# Patient Record
Sex: Male | Born: 1975 | Race: White | Hispanic: No | Marital: Single
Health system: Midwestern US, Community
[De-identification: ages and names within clinical notes are randomized; demographics above are authoritative.]

## PROBLEM LIST (undated history)

## (undated) DIAGNOSIS — K603 Anal fistula, unspecified: Secondary | ICD-10-CM

## (undated) DIAGNOSIS — K641 Second degree hemorrhoids: Secondary | ICD-10-CM

## (undated) DIAGNOSIS — K602 Anal fissure, unspecified: Secondary | ICD-10-CM

## (undated) DIAGNOSIS — I10 Essential (primary) hypertension: Secondary | ICD-10-CM

## (undated) DIAGNOSIS — G35 Multiple sclerosis: Secondary | ICD-10-CM

## (undated) DIAGNOSIS — E785 Hyperlipidemia, unspecified: Secondary | ICD-10-CM

## (undated) DIAGNOSIS — R4189 Other symptoms and signs involving cognitive functions and awareness: Secondary | ICD-10-CM

---

## 2012-04-14 ENCOUNTER — Inpatient Hospital Stay: Admit: 2012-04-14 | Discharge: 2012-04-14 | Discharge status: Against Medical Advice

## 2016-03-12 DIAGNOSIS — G35 Multiple sclerosis: Secondary | ICD-10-CM | POA: Diagnosis present

## 2017-05-06 ENCOUNTER — Inpatient Hospital Stay: Admit: 2017-05-06 | Discharge: 2017-05-06 | Attending: Emergency Medicine

## 2017-05-06 DIAGNOSIS — S0083XA Contusion of other part of head, initial encounter: Secondary | ICD-10-CM

## 2017-05-06 NOTE — Discharge Instructions (Signed)
ICE area to reduce swelling and help with pain

## 2017-05-06 NOTE — ED Notes (Signed)
Patient given AVS, discharge instructions and prescriptions. Verbalizes understanding no further needs identified at this time.     Darden Amber, RN  05/06/17 [phone]

## 2017-05-06 NOTE — ED Provider Notes (Signed)
The history is provided by the patient.   Fall   Incident onset: 3 days ago. Fall occurred: missed his wheel chair and fell forward hit head. He landed on a hard floor. There was no blood loss. The point of impact was the head. The pain is present in the head. There was no entrapment after the fall. There was no drug use involved in the accident. There was no alcohol use involved in the accident. Pertinent negatives include no visual change, no fever, no numbness, no abdominal pain, no bowel incontinence, no nausea, no vomiting, no hematuria, no headaches, no hearing loss, no loss of consciousness and no tingling. He has tried ice for the symptoms. The treatment provided mild relief.       Review of Systems   Constitutional: Negative.  Negative for fever.   HENT: Negative.    Eyes: Negative.    Respiratory: Negative.    Cardiovascular: Negative.    Gastrointestinal: Negative.  Negative for abdominal pain, bowel incontinence, nausea and vomiting.   Genitourinary: Negative.  Negative for hematuria.   Musculoskeletal: Negative.    Skin: Negative.    Neurological: Negative.  Negative for tingling, loss of consciousness, numbness and headaches.   Endo/Heme/Allergies: Negative.    All other systems reviewed and are negative.      History reviewed. No pertinent family history.  Social History     Social History   . Marital status: Single     Spouse name: N/A   . Number of children: N/A   . Years of education: N/A     Occupational History   . Not on file.     Social History Main Topics   . Smoking status: Never Smoker   . Smokeless tobacco: Never Used   . Alcohol use No   . Drug use: No   . Sexual activity: No     Other Topics Concern   . Not on file     Social History Narrative   . No narrative on file     History reviewed. No pertinent surgical history.  Past Medical History:   Diagnosis Date   . MS (multiple sclerosis) (HCC)      No Known Allergies  Prior to Admission medications    Medication Sig Start Date End Date  Taking? Authorizing Provider   baclofen (LIORESAL) 10 MG tablet Take 10 mg by mouth 2 times daily 08/23/16  Yes Historical Provider, MD   vitamin D (ERGOCALCIFEROL) 50000 units CAPS capsule Take 50,000 Units by mouth once a week 05/05/17  Yes Historical Provider, MD   gabapentin (NEURONTIN) 300 MG capsule Take 300 mg by mouth 3 times daily.. 07/08/16  Yes Historical Provider, MD   tolterodine (DETROL) 2 MG tablet Take 2 mg by mouth daily    Historical Provider, MD   sertraline (ZOLOFT) 50 MG tablet Take 50 mg by mouth daily    Historical Provider, MD   losartan-hydrochlorothiazide (HYZAAR) 50-12.5 MG per tablet Take 1 tablet by mouth daily    Historical Provider, MD       BP 129/89   Pulse 100   Temp 98.4 F (36.9 C) (Oral)   Resp 16   Ht 5\' 8"  (1.727 m)   Wt 186 lb (84.4 kg)   SpO2 99%   BMI 28.28 kg/m     Physical Exam   Constitutional: He is oriented to person, place, and time. He appears well-developed and well-nourished.   HENT:   Head: Normocephalic.  Right Ear: External ear normal.   Left Ear: External ear normal.   Nose: Nose normal.   Mouth/Throat: Oropharynx is clear and moist.   Blue circle is some bruising and tenderness   Eyes: Pupils are equal, round, and reactive to light. Conjunctivae and EOM are normal.   Neck: Normal range of motion. Neck supple.   Cardiovascular: Normal rate, regular rhythm, normal heart sounds and intact distal pulses.    Pulmonary/Chest: Effort normal and breath sounds normal.   Abdominal: Soft. Bowel sounds are normal.   Musculoskeletal: Normal range of motion.   Neurological: He is alert and oriented to person, place, and time. GCS eye subscore is 4. GCS verbal subscore is 5. GCS motor subscore is 6.   Skin: Skin is warm and dry.   Psychiatric: He has a normal mood and affect. His behavior is normal. Judgment and thought content normal.       MDM:    No results found for this visit on 05/06/17.  CT Facial Bones WO Contrast   Final Result   No acute traumatic injury  of the facial bones.               Ice and OTC motrin  My typical dicussion, presentation, and considerations for this patients' chief complaint, diagnosis, differential diagnosis, medications, medication use,  medication safety and medication interactions have been explained and outlined to this patient for this patient encounter. I have stressed need for follow up and reexamination for this encounter and or return to the emergency department if any changes or any concern.      Final Impression    1. Contusion of face, initial encounter              Cory Scales, DO  05/06/17 1947

## 2018-05-17 ENCOUNTER — Emergency Department: Admit: 2018-05-17 | Payer: MEDICAID | Primary: Internal Medicine

## 2018-05-17 ENCOUNTER — Inpatient Hospital Stay
Admit: 2018-05-17 | Discharge: 2018-05-17 | Disposition: A | Discharge status: Home / Self Care | Payer: MEDICAID | Attending: Emergency Medicine

## 2018-05-17 DIAGNOSIS — S7001XA Contusion of right hip, initial encounter: Secondary | ICD-10-CM

## 2018-05-17 LAB — URINALYSIS WITH MICROSCOPIC
Bilirubin Urine: NEGATIVE MG/DL
Cast Type: NEGATIVE /HPF — AB
Crystal Type: NEGATIVE /HPF
Epithelial Cells, UA: 1 /HPF
Glucose, Urine: NEGATIVE MG/DL
Ketones, Urine: NEGATIVE MG/DL
Nitrite Urine, Quantitative: NEGATIVE
RBC, UA: NEGATIVE /HPF (ref 0–3)
Specific Gravity, UA: 1.015 (ref 1.001–1.035)
Urobilinogen, Urine: 0.2 MG/DL (ref 0.2–1.0)
WBC, UA: 78 /HPF (ref 0–2)
pH, Urine: 6 (ref 5.0–8.0)

## 2018-05-17 MED ORDER — PHENAZOPYRIDINE HCL 100 MG PO TABS
100 MG | ORAL_TABLET | Freq: Three times a day (TID) | ORAL | 0 refills | Status: AC | PRN
Start: 2018-05-17 — End: 2018-05-20

## 2018-05-17 MED ORDER — SULFAMETHOXAZOLE-TRIMETHOPRIM 800-160 MG PO TABS
800-160 MG | ORAL_TABLET | Freq: Two times a day (BID) | ORAL | 0 refills | Status: AC
Start: 2018-05-17 — End: 2018-05-27

## 2018-05-17 NOTE — ED Provider Notes (Signed)
Emergency Department Encounter  Location: [facility] EMERGENCY DEPARTMENT    Patient: Cory Nelson  MRN: [id number]  DOB: 1976/02/06  Date of evaluation: 05/17/2018  ED Provider: Brayton Layman, DO, FACEP    Chief Complaint:    Hip Pain (Pt arrives ambulatory with father who states pt has to be catheterized 3-4 times a day.  Father states he has noticed in last week that his urine is milky white.   Pt denies any urinary sx but complains of hip pain) and Dysuria    HOPI:  Cory Nelson is a 42 y.o. male that presents to the emergency department with complaints of possible urinary tract infection.  His father states that this patient is to be Catheterized 3-4 times daily.  He states that he is noticed that his urine is milky white.  He denies any urinary symptoms but he is having pain in his right hip.  He states he slipped and fell in the bathroom.  This was about a week ago and since then he is been having pain in his right lateral hip.  He has not taken any ibuprofen or Tylenol for the pain.    ROS:  At least 4 systems reviewed and otherwise acutely negative except as in the HOPI.    Past Medical History:   Diagnosis Date   ??? GERD (gastroesophageal reflux disease)    ??? Hyperlipidemia    ??? Hypertension    ??? MS (multiple sclerosis) (HCC)      Past Surgical History:   Procedure Laterality Date   ??? COLONOSCOPY     ??? UPPER GASTROINTESTINAL ENDOSCOPY       History reviewed. No pertinent family history.  Social History     Socioeconomic History   ??? Marital status: Single     Spouse name: Not on file   ??? Number of children: Not on file   ??? Years of education: Not on file   ??? Highest education level: Not on file   Occupational History   ??? Not on file   Social Needs   ??? Financial resource strain: Not on file   ??? Food insecurity:     Worry: Not on file     Inability: Not on file   ??? Transportation needs:     Medical: Not on file     Non-medical: Not on file   Tobacco Use   ??? Smoking status: Current Every Day Smoker      Types: Cigarettes   ??? Smokeless tobacco: Never Used   Substance and Sexual Activity   ??? Alcohol use: No   ??? Drug use: Yes     Types: Marijuana   ??? Sexual activity: Never   Lifestyle   ??? Physical activity:     Days per week: Not on file     Minutes per session: Not on file   ??? Stress: Not on file   Relationships   ??? Social connections:     Talks on phone: Not on file     Gets together: Not on file     Attends religious service: Not on file     Active member of club or organization: Not on file     Attends meetings of clubs or organizations: Not on file     Relationship status: Not on file   ??? Intimate partner violence:     Fear of current or ex partner: Not on file     Emotionally abused: Not on file  Physically abused: Not on file     Forced sexual activity: Not on file   Other Topics Concern   ??? Not on file   Social History Narrative   ??? Not on file     No current facility-administered medications for this encounter.      Current Outpatient Medications   Medication Sig Dispense Refill   ??? atorvastatin (LIPITOR) 10 MG tablet Take 10 mg by mouth daily     ??? carbidopa-levodopa (SINEMET) 25-100 MG per tablet Take 1 tablet by mouth 2 times daily     ??? sulfamethoxazole-trimethoprim (BACTRIM DS) 800-160 MG per tablet Take 1 tablet by mouth 2 times daily for 10 days 20 tablet 0   ??? phenazopyridine (PYRIDIUM) 100 MG tablet Take 2 tablets by mouth 3 times daily as needed for Pain 18 tablet 0   ??? baclofen (LIORESAL) 10 MG tablet Take 10 mg by mouth 2 times daily     ??? vitamin D (ERGOCALCIFEROL) 50000 units CAPS capsule Take 50,000 Units by mouth once a week     ??? gabapentin (NEURONTIN) 300 MG capsule Take 600 mg by mouth 3 times daily.      ??? tolterodine (DETROL) 2 MG tablet Take 2 mg by mouth daily     ??? sertraline (ZOLOFT) 50 MG tablet Take 50 mg by mouth daily     ??? losartan-hydrochlorothiazide (HYZAAR) 50-12.5 MG per tablet Take 1 tablet by mouth daily       No Known Allergies  Nursing Notes Reviewed    Physical  Exam:  ED Triage Vitals [05/17/18 1024]   Enc Vitals Group      BP (!) 143/94      Pulse 58      Resp 20      Temp 98.1 ??F (36.7 ??C)      Temp Source Oral      SpO2 98 %      Weight 193 lb (87.5 kg)      Height 5\' 8"  (1.727 m)      Head Circumference       Peak Flow       Pain Score       Pain Loc       Pain Edu?       Excl. in GC?      GENERAL APPEARANCE: Awake and alert. Cooperative. No acute distress.  Non-toxic in appearance  HEAD: Normocephalic. Atraumatic.  EYES: Sclera anicteric.  ENT: Tolerates saliva.  NECK: Supple. Trachea midline.  LUNGS: Respirations unlabored.  EXTREMITIES: No acute deformities.  No pain with movement of his right hip.  There is pain to palpation in the right lateral hip region but no evidence of trauma with no bruising.  There is no redness.  SKIN: Warm and dry.  NEUROLOGICAL: No gross facial drooping.  PSYCHIATRIC: Normal mood.    Labs:  Results for orders placed or performed during the hospital encounter of 05/17/18   Urinalysis with Microscopic   Result Value Ref Range    Color, UA YELLOW YELLOW    Clarity, UA CLOUDY CLEAR    Glucose, Urine NEGATIVE NEGATIVE MG/DL    Bilirubin Urine NEGATIVE NEGATIVE MG/DL    Ketones, Urine NEGATIVE NEGATIVE MG/DL    Specific Gravity, UA 1.015 1.001 - 1.035    Blood, Urine TRACE NEGATIVE    pH, Urine 6.0 5.0 - 8.0    Protein, UA TRACE NEGATIVE MG/DL    Urobilinogen, Urine 0.2 0.2 - 1.0 MG/DL  Nitrite Urine, Quantitative NEGATIVE NEGATIVE    Leukocyte Esterase, Urine 2+ NEGATIVE    RBC, UA NEGATIVE 0 - 3 /HPF    WBC, UA 78 TO 95 0 - 2 /HPF    Epi Cells 1 TO 3 /HPF    Cast Type NEGATIVE (A) NO CAST FORMS SEEN /HPF    Bacteria, UA MANY NEGATIVE /HPF    Crystal Type NEGATIVE NEGATIVE /HPF       Radiographs (if obtained):  []  The following radiograph was interpreted by myself in the absence of a radiologist:  [x]  Radiologist's Report reviewed at time of ED visit:  XR HIP RIGHT (2-3 VIEWS)   Preliminary Result   Negative views of the right hip.              ED Course and MDM:  Patient's urinalysis shows a urinary tract infection.  He will be started on Bactrim DS.  He will be started on Pyridium.  His right hip x-ray shows no evidence of fracture.  I think he has a contusion or strain in this area.  I did offer ibuprofen or Naprosyn for him but he states he will will not use that medication because he "does not like taking medicines" according to his father.  He will be referred back to his primary caregiver for recheck.  He is discharged in stable condition at this time.    Final Impression:  1. Acute cystitis without hematuria    2. Contusion of right hip, initial encounter      DISPOSITION Decision To Discharge    Patient referred to:  your doctor    Schedule an appointment as soon as possible for a visit in 1 week  For follow up    Discharge medications:  Discharge Medication List as of 05/17/2018 12:16 PM      START taking these medications    Details   sulfamethoxazole-trimethoprim (BACTRIM DS) 800-160 MG per tablet Take 1 tablet by mouth 2 times daily for 10 days, Disp-20 tablet, R-0Print      phenazopyridine (PYRIDIUM) 100 MG tablet Take 2 tablets by mouth 3 times daily as needed for Pain, Disp-18 tablet, R-0Print           (Please note that portions of this note may have been completed with a voice recognition program. Efforts were made to edit the dictations but occasionally words are mis-transcribed.)    Brayton Layman, DO, FACEP  Board certified in Emergency Medicine  [employer]        Brayton Layman, DO  05/17/18 1236

## 2018-05-17 NOTE — ED Notes (Signed)
Pt catheterized for urine, cloudy urine out, specimen sent to lab.     Dionne Milo, RN  05/17/18 1227

## 2018-05-17 NOTE — Discharge Instructions (Signed)
Urinary Tract Infections in Men: Care Instructions  Your Care Instructions    A urinary tract infection, or UTI, is a general term for an infection anywhere between the kidneys and the tip of the penis. UTIs can also be a result of a prostate problem. Most cause pain or burning when you urinate.  Most UTIs are caused by bacteria and can be cured with antibiotics. It is important to complete your treatment so that the infection does not get worse.  Follow-up care is a key part of your treatment and safety. Be sure to make and go to all appointments, and call your doctor if you are having problems. It's also a good idea to know your test results and keep a list of the medicines you take.  How can you care for yourself at home?   Take your antibiotics as prescribed. Do not stop taking them just because you feel better. You need to take the full course of antibiotics.   Take your medicines exactly as prescribed. Your doctor may have prescribed a medicine, such as phenazopyridine (Pyridium), to help relieve pain when you urinate. This turns your urine orange. You may stop taking it when your symptoms get better. But be sure to take all of your antibiotics, which treat the infection.   Drink extra water for the next day or two. This will help make the urine less concentrated and help wash out the bacteria causing the infection. (If you have kidney, heart, or liver disease and have to limit your fluids, talk with your doctor before you increase your fluid intake.)   Avoid drinks that are carbonated or have caffeine. They can irritate the bladder.   Urinate often. Try to empty your bladder each time.   To relieve pain, take a hot bath or lay a heating pad (set on low) over your lower belly or genital area. Never go to sleep with a heating pad in place.  To help prevent UTIs   Drink plenty of fluids, enough so that your urine is light yellow or clear like water. If you have kidney, heart, or liver disease and  have to limit fluids, talk with your doctor before you increase the amount of fluids you drink.   Urinate when you have the urge. Do not hold your urine for a long time. Urinate before you go to sleep.   Keep your penis clean.  Catheter care  If you have a drainage tube (catheter) in place, the following steps will help you care for it.   Always wash your hands before and after touching your catheter.   Check the area around the urethra for inflammation or signs of infection. Signs of infection include irritated, swollen, red, or tender skin, or pus around the catheter.   Clean the area around the catheter with soap and water two times a day. Dry with a clean towel afterward.   Do not apply powder or lotion to the skin around the catheter.  To empty the urine collection bag   Wash your hands with soap and water.   Without touching the drain spout, remove the spout from its sleeve at the bottom of the collection bag. Open the valve on the spout.   Let the urine flow out of the bag and into the toilet or a container. Do not let the tubing or drain spout touch anything.   After you empty the bag, clean the end of the drain spout with tissue and water. Close the   valve and put the drain spout back into its sleeve at the bottom of the collection bag.   Wash your hands with soap and water.  When should you call for help?  Call your doctor now or seek immediate medical care if:    Symptoms such as a fever, chills, nausea, or vomiting get worse or happen for the first time.     You have new pain in your back just below your rib cage. This is called flank pain.     There is new blood or pus in your urine.     You are not able to take or keep down your antibiotics.   Watch closely for changes in your health, and be sure to contact your doctor if:    You are not getting better after taking an antibiotic for 2 days.     Your symptoms go away but then come back.   Where can you learn more?  Go to  [email or web address] and sign in to your MyChart account. Enter S351 in the Search Health Information box to learn more about "Urinary Tract Infections in Men: Care Instructions."     If you do not have an account, please click on the "Sign Up Now" link.  Current as of: September 03, 2017  Content Version: 12.1   2006-2019 [employer]. Care instructions adapted under license by [facility]. If you have questions about a medical condition or this instruction, always ask your healthcare professional. [employer], Inc[employer] disclaims any warranty or liability for your use of this information.

## 2018-05-20 LAB — CULTURE, URINE: Total Colony Count: >100000

## 2018-07-20 ENCOUNTER — Inpatient Hospital Stay
Admit: 2018-07-20 | Discharge: 2018-07-21 | Disposition: A | Discharge status: Other Acute Inpatient Hospital | Payer: MEDICAID | Attending: Emergency Medicine

## 2018-07-20 ENCOUNTER — Emergency Department: Admit: 2018-07-20 | Payer: MEDICAID | Primary: Internal Medicine

## 2018-07-20 DIAGNOSIS — A419 Sepsis, unspecified organism: Principal | ICD-10-CM

## 2018-07-20 LAB — COMPREHENSIVE METABOLIC PANEL
ALT: 118 U/L — ABNORMAL HIGH (ref 10–40)
AST: 142 IU/L — ABNORMAL HIGH (ref 15–37)
Albumin: 4.7 GM/DL (ref 3.4–5.0)
Alkaline Phosphatase: 120 IU/L (ref 40–129)
Anion Gap: 16 (ref 4–16)
BUN: 15 MG/DL (ref 6–23)
CO2: 25 MMOL/L (ref 21–32)
Calcium: 10.2 MG/DL (ref 8.3–10.6)
Chloride: 97 mMol/L — ABNORMAL LOW (ref 99–110)
Creatinine: 1.4 MG/DL — ABNORMAL HIGH (ref 0.9–1.3)
GFR African American: >60 mL/min/{1.73_m2} (ref >60)
GFR Non-African American: 55 mL/min/{1.73_m2} — ABNORMAL LOW (ref >60)
Glucose: 124 MG/DL — ABNORMAL HIGH (ref 70–99)
Potassium: 3.5 MMOL/L (ref 3.5–5.1)
Sodium: 138 MMOL/L (ref 135–145)
Total Bilirubin: 0.8 MG/DL (ref 0.0–1.0)
Total Protein: 8 GM/DL (ref 6.4–8.2)

## 2018-07-20 LAB — CBC WITH AUTO DIFFERENTIAL
Basophils %: 0.4 % (ref 0–1)
Basophils Absolute: 0.1 10*3/uL
Eosinophils %: 0.5 % (ref 0–3)
Eosinophils Absolute: 0.1 10*3/uL
Hematocrit: 44 % (ref 42–52)
Hemoglobin: 14.4 GM/DL (ref 13.5–18.0)
Immature Neutrophil %: 0.6 % — ABNORMAL HIGH (ref 0–0.43)
Lymphocytes %: 8.9 % — ABNORMAL LOW (ref 24–44)
Lymphocytes Absolute: 2.1 10*3/uL
MCH: 28.1 PG (ref 27–31)
MCHC: 32.7 % (ref 32.0–36.0)
MCV: 85.9 FL (ref 78–100)
MPV: 10.5 FL (ref 7.5–11.1)
Monocytes %: 12.7 % — ABNORMAL HIGH (ref 0–4)
Monocytes Absolute: 3 10*3/uL
Platelets: 302 10*3/uL (ref 140–440)
RBC: 5.12 10*6/uL (ref 4.6–6.2)
RDW: 13.2 % (ref 11.7–14.9)
Segs Absolute: 17.9 10*3/uL
Segs Relative: 76.9 % — ABNORMAL HIGH (ref 36–66)
Total Immature Neutrophil: 0.14 10*3/uL
WBC: 23.2 10*3/uL — ABNORMAL HIGH (ref 4.0–10.5)

## 2018-07-20 LAB — URINALYSIS WITH MICROSCOPIC
Bilirubin Urine: NEGATIVE MG/DL
Cast Type: NONE SEEN /HPF
Crystal Type: NONE SEEN /HPF
Epithelial Cells, UA: NONE SEEN /HPF
Glucose, Urine: NEGATIVE MG/DL
Ketones, Urine: NEGATIVE MG/DL
Mucus, UA: NEGATIVE [HPF]
Nitrite Urine, Quantitative: NEGATIVE
Protein, UA: 30 MG/DL — AB
RBC, UA: 6 /HPF (ref 0–3)
Specific Gravity, UA: 1.025 (ref 1.001–1.035)
Urobilinogen, Urine: 0.2 MG/DL (ref 0.2–1.0)
Volume, (UVOL): 12 ML (ref 10–12)
WBC, UA: 3 /HPF (ref 0–2)
pH, Urine: 6 (ref 5.0–8.0)

## 2018-07-20 LAB — POCT GLUCOSE: POC Glucose: 112 MG/DL — ABNORMAL HIGH (ref 70–99)

## 2018-07-20 LAB — LACTIC ACID: Lactate: 1.4 mMOL/L (ref 0.4–2.0)

## 2018-07-20 MED ORDER — ACETAMINOPHEN 500 MG PO TABS
500 MG | Freq: Once | ORAL | Status: AC
Start: 2018-07-20 — End: 2018-07-20
  Administered 2018-07-20: 19:00:00 1000 mg via ORAL

## 2018-07-20 MED ORDER — SODIUM CHLORIDE 0.9 % IV SOLN
0.9 % | INTRAVENOUS | Status: DC
Start: 2018-07-20 — End: 2018-07-20
  Administered 2018-07-20: 19:00:00 1000 via INTRAVENOUS

## 2018-07-20 MED ORDER — PIPERACILLIN-TAZOBACTAM IN DEX 3-0.375 GM/50ML IV SOLN
Freq: Once | INTRAVENOUS | Status: AC
Start: 2018-07-20 — End: 2018-07-20
  Administered 2018-07-20: 20:00:00 3.375 g via INTRAVENOUS

## 2018-07-20 MED ORDER — SODIUM CHLORIDE 0.9 % IV SOLN
0.9 % | INTRAVENOUS | Status: DC
Start: 2018-07-20 — End: 2018-07-20

## 2018-07-20 MED ORDER — IOPAMIDOL 76 % IV SOLN
76 % | Freq: Once | INTRAVENOUS | Status: AC | PRN
Start: 2018-07-20 — End: 2018-07-20
  Administered 2018-07-20: 75 mL via INTRAVENOUS

## 2018-07-20 MED ORDER — IBUPROFEN 800 MG PO TABS
800 MG | Freq: Once | ORAL | Status: AC
Start: 2018-07-20 — End: 2018-07-20
  Administered 2018-07-20: 20:00:00 800 mg via ORAL

## 2018-07-20 MED ORDER — NORMAL SALINE FLUSH 0.9 % IV SOLN
0.9 % | INTRAVENOUS | Status: DC | PRN
Start: 2018-07-20 — End: 2018-07-20
  Administered 2018-07-20: 19:00:00 10 mL via INTRAVENOUS

## 2018-07-20 MED ORDER — NORMAL SALINE FLUSH 0.9 % IV SOLN
0.9 | INTRAVENOUS | Status: AC
Start: 2018-07-20 — End: 2018-07-20

## 2018-07-20 MED ORDER — SODIUM CHLORIDE 0.9 % IV BOLUS
0.9 % | Freq: Once | INTRAVENOUS | Status: AC
Start: 2018-07-20 — End: 2018-07-20
  Administered 2018-07-20: 19:00:00 1000 mL via INTRAVENOUS

## 2018-07-20 MED FILL — NORMAL SALINE FLUSH 0.9 % IV SOLN: 0.9 % | INTRAVENOUS | Qty: 40

## 2018-07-20 MED FILL — TYLENOL EXTRA STRENGTH 500 MG PO TABS: 500 mg | ORAL | Qty: 2

## 2018-07-20 MED FILL — ZOSYN 3-0.375 GM/50ML IV SOLN: 3-0.375 GM/50ML | INTRAVENOUS | Qty: 50

## 2018-07-20 MED FILL — IBUPROFEN 800 MG PO TABS: 800 mg | ORAL | Qty: 1

## 2018-07-20 MED FILL — SODIUM CHLORIDE 0.9 % IV SOLN: 0.9 % | INTRAVENOUS | Qty: 1000

## 2018-07-20 NOTE — ED Notes (Signed)
Meal tray given to this patient.     Cory Nelson  07/20/18 1747

## 2018-07-20 NOTE — ED Notes (Signed)
Transport team from [facility] here to transport patient to [place]. [facility] . Report given to transport team. Patient care transferred to transport team at this time. Patient stable at time of transport.     Darrol Jump, RN  07/20/18 2056

## 2018-07-20 NOTE — ED Notes (Signed)
Resting quietly at this time with no complaints. Side rails up and call light within reach.  Respirations even and unlabored.  Pain is currently at  0/10.  Awaiting transfer to [facility]. Pt and family informed and plan of care reviewed.  Pt and family acknowledges understanding and voices agreement     Darrol Jump, RN  07/20/18 2050

## 2018-07-20 NOTE — ED Notes (Signed)
Attempted to call report to [place]. Nurse unavailable for report will call me back.      Darrol Jump, RN  07/20/18 [phone]

## 2018-07-20 NOTE — ED Notes (Signed)
Discussed during nurse to nurse report was, including but not limited to:   client's purpose of admission and/or transfer, initial and current mental status, vital signs, medication interventions or procedures, abnormal test results, and significant events or changes that occurred during the admission. The receiving nurse was prompted to ask questions and informed that the current department staff could be contacted for additional questions if needed. Report discussed with Stefano Gaul.     Darrol Jump, RN  07/20/18 2120

## 2018-07-20 NOTE — ED Notes (Signed)
[facility] called to initiate transfer to [place]. [facility]. Transfer center will page doctor and call us back when they get doctor on the phone.      Darrol Jump, RN  07/20/18 [phone]

## 2018-07-20 NOTE — ED Notes (Signed)
Dr. Katrinka Blazing spoke with Dr. Willey Blade regarding admission for this patient.     Cory Nelson  07/20/18 1456

## 2018-07-20 NOTE — ED Notes (Signed)
Rounding Note:  Resting quietly at this time with no complaints. Side rails up and call light within reach.  Respirations even and unlabored.  Pain is currently at  0/10.  Awaiting lab/ radiology results. Pt informed and plan of care reviewed.  Pt acknowledges understanding and voices agreement.     Darrol Jump, RN  07/20/18 [phone]

## 2018-07-20 NOTE — ED Notes (Signed)
[facility] gave an ETA of 2000.     Cory Nelson  07/20/18 1629

## 2018-07-20 NOTE — ED Notes (Signed)
Resting quietly at this time with no complaints. Side rails up and call light within reach.  Respirations even and unlabored.  Pain is currently at  0/10.  Awaiting transfer to [facility]. Pt and family informed and plan of care reviewed.  Pt and family acknowledges understanding and voices agreement     Darrol Jump, RN  07/20/18 2051

## 2018-07-20 NOTE — ED Notes (Signed)
Rounding Note:  Resting quietly at this time with no complaints. Side rails up and call light within reach.  Respirations even and unlabored.  Pain is currently at  0/10.  Awaiting lab/ radiology results. Pt informed and plan of care reviewed.  Pt acknowledges understanding and voices agreement.     Darrol Jump, RN  07/20/18 1524

## 2018-07-20 NOTE — ED Notes (Signed)
Resting quietly at this time with no complaints. Side rails up and call light within reach.  Respirations even and unlabored.  Pain is currently at  0/10.  Awaiting transfer to [facility]. Pt and family informed and plan of care reviewed.  Pt and family acknowledges understanding and voices agreement     Darrol Jump, RN  07/20/18 2052

## 2018-07-20 NOTE — ED Notes (Signed)
Dr. Katrinka Blazing is speaking with [facility].     Sheliah Mends  07/20/18 1559

## 2018-07-20 NOTE — ED Notes (Signed)
Resting quietly at this time with no complaints. Side rails up and call light within reach.  Respirations even and unlabored.  Pain is currently at  0/10.  Awaiting transfer to [facility]. Pt and family informed and plan of care reviewed.  Pt and family acknowledges understanding and voices agreement     Shirah Roseman, RN  07/20/18 2051

## 2018-07-20 NOTE — ED Notes (Signed)
Resting quietly at this time with no complaints. Side rails up and call light within reach.  Respirations even and unlabored.  Pain is currently at  0/10.  Awaiting transfer to [facility]. Pt and family informed and plan of care reviewed.  Pt and family acknowledges understanding and voices agreement     Ruel Dimmick, RN  07/20/18 2051

## 2018-07-20 NOTE — ED Provider Notes (Signed)
Emergency Department Encounter  Location: [facility] EMERGENCY DEPARTMENT    Patient: Johnie Makki  MRN: [id number]  DOB: 18-Feb-1976  Date of evaluation: 07/20/2018  ED Provider: Brayton Layman, DO, FACEP    Chief Complaint:    Altered Mental Status (Pt arrives per ems from home, ems staff state pt has had altered mental status for 2 days. Pt alert to name only   Bruises noted to right forearm.  father arrives and states pt choked on medications yesterday and later began to act "listless") and Dysuria (Pt father states he straight cath's patient and father noticed urine milky looking)    HOPI:  Armonie Staten is a 42 y.o. male that presents to the emergency department with complaints of fever and decreased mental status.  The patient has a history of MS and his father stated yesterday he choked on his medications and was concerned that he may have aspirated.  The patient is also had recent urinary tract infection and his father's noticed that his urine does look milky and he is not producing much urine which often accompanies a urinary tract infection in this individual.  He is awake and alert and denies any shortness of breath or chest pain at this time.  He has had an elevated heart rate according to his father.  Upon arrival his temperature was 101.    ROS:  At least 10 systems reviewed and otherwise acutely negative except as in the HOPI.    Past Medical History:   Diagnosis Date   ??? GERD (gastroesophageal reflux disease)    ??? Hyperlipidemia    ??? Hypertension    ??? MS (multiple sclerosis) (HCC)      Past Surgical History:   Procedure Laterality Date   ??? COLONOSCOPY     ??? UPPER GASTROINTESTINAL ENDOSCOPY       History reviewed. No pertinent family history.  Social History     Socioeconomic History   ??? Marital status: Single     Spouse name: Not on file   ??? Number of children: Not on file   ??? Years of education: Not on file   ??? Highest education level: Not on file   Occupational History   ??? Not on file   Social  Needs   ??? Financial resource strain: Not on file   ??? Food insecurity:     Worry: Not on file     Inability: Not on file   ??? Transportation needs:     Medical: Not on file     Non-medical: Not on file   Tobacco Use   ??? Smoking status: Current Every Day Smoker     Packs/day: 1.00     Types: Cigarettes   ??? Smokeless tobacco: Never Used   Substance and Sexual Activity   ??? Alcohol use: No   ??? Drug use: Yes     Types: Marijuana   ??? Sexual activity: Never   Lifestyle   ??? Physical activity:     Days per week: Not on file     Minutes per session: Not on file   ??? Stress: Not on file   Relationships   ??? Social connections:     Talks on phone: Not on file     Gets together: Not on file     Attends religious service: Not on file     Active member of club or organization: Not on file     Attends meetings of clubs or organizations: Not on  file     Relationship status: Not on file   ??? Intimate partner violence:     Fear of current or ex partner: Not on file     Emotionally abused: Not on file     Physically abused: Not on file     Forced sexual activity: Not on file   Other Topics Concern   ??? Not on file   Social History Narrative   ??? Not on file     Current Facility-Administered Medications   Medication Dose Route Frequency Provider Last Rate Last Dose   ??? 0.9 % sodium chloride infusion   Intravenous Continuous Brayton Layman, DO 150 mL/hr at 07/20/18 1421 1,000 mL at 07/20/18 1421   ??? sodium chloride flush 0.9 % injection 10 mL  10 mL Intravenous PRN Brayton Layman, DO   10 mL at 07/20/18 1400     Current Outpatient Medications   Medication Sig Dispense Refill   ??? atorvastatin (LIPITOR) 10 MG tablet Take 10 mg by mouth daily     ??? carbidopa-levodopa (SINEMET) 25-100 MG per tablet Take 1 tablet by mouth 2 times daily     ??? baclofen (LIORESAL) 10 MG tablet Take 10 mg by mouth 2 times daily     ??? vitamin D (ERGOCALCIFEROL) 50000 units CAPS capsule Take 50,000 Units by mouth once a week     ??? gabapentin (NEURONTIN) 300 MG capsule Take  600 mg by mouth 3 times daily.      ??? tolterodine (DETROL) 2 MG tablet Take 2 mg by mouth daily     ??? sertraline (ZOLOFT) 50 MG tablet Take 50 mg by mouth daily     ??? losartan-hydrochlorothiazide (HYZAAR) 50-12.5 MG per tablet Take 1 tablet by mouth daily       No Known Allergies    Nursing Notes Reviewed    Physical Exam:  ED Triage Vitals [07/20/18 1341]   Enc Vitals Group      BP (!) 140/102      Pulse 128      Resp 22      Temp 101.9 ??F (38.8 ??C)      Temp Source Oral      SpO2 96 %      Weight 199 lb 9 oz (90.5 kg)      Height 5' 8.5" (1.74 m)      Head Circumference       Peak Flow       Pain Score       Pain Loc       Pain Edu?       Excl. in GC?      GENERAL APPEARANCE: Awake and alert. Cooperative. No acute distress.  Nontoxic in appearance  HEAD: Normocephalic. Atraumatic.   EYES: EOM's grossly intact. Sclera anicteric.   ENT: Tolerates saliva. No trismus.   NECK: Supple. Trachea midline.   CARDIO: Sinus tachycardia seen on monitor. Radial pulse 2+.   LUNGS: Respirations unlabored. CTAB.  ABDOMEN: Soft. Non-distended. Non-tender.  No guarding or rebound  EXTREMITIES: No acute deformities.   SKIN: Warm and dry.  Skin is hot  NEUROLOGICAL: No gross facial drooping. Moves all 4 extremities spontaneously.   PSYCHIATRIC: Normal mood.     Labs:  Results for orders placed or performed during the hospital encounter of 07/20/18   CBC Auto Differential   Result Value Ref Range    WBC 23.2 (H) 4.0 - 10.5 K/CU MM    RBC 5.12 4.6 -  6.2 M/CU MM    Hemoglobin 14.4 13.5 - 18.0 GM/DL    Hematocrit 13.0 42 - 52 %    MCV 85.9 78 - 100 FL    MCH 28.1 27 - 31 PG    MCHC 32.7 32.0 - 36.0 %    RDW 13.2 11.7 - 14.9 %    Platelets 302 140 - 440 K/CU MM    MPV 10.5 7.5 - 11.1 FL    Differential Type AUTOMATED DIFFERENTIAL     Segs Relative 76.9 (H) 36 - 66 %    Lymphocytes % 8.9 (L) 24 - 44 %    Monocytes % 12.7 (H) 0 - 4 %    Eosinophils % 0.5 0 - 3 %    Basophils % 0.4 0 - 1 %    Segs Absolute 17.9 K/CU MM    Lymphocytes Absolute  2.1 K/CU MM    Monocytes Absolute 3.0 K/CU MM    Eosinophils Absolute 0.1 K/CU MM    Basophils Absolute 0.1 K/CU MM    Immature Neutrophil % 0.6 (H) 0 - 0.43 %    Total Immature Neutrophil 0.14 K/CU MM   Comprehensive Metabolic Panel   Result Value Ref Range    Sodium 138 135 - 145 MMOL/L    Potassium 3.5 3.5 - 5.1 MMOL/L    Chloride 97 (L) 99 - 110 mMol/L    CO2 25 21 - 32 MMOL/L    BUN 15 6 - 23 MG/DL    CREATININE 1.4 (H) 0.9 - 1.3 MG/DL    Glucose 865 (H) 70 - 99 MG/DL    Calcium 78.4 8.3 - 69.6 MG/DL    Alb 4.7 3.4 - 5.0 GM/DL    Total Protein 8.0 6.4 - 8.2 GM/DL    Total Bilirubin 0.8 0.0 - 1.0 MG/DL    ALT 295 (H) 10 - 40 U/L    AST 142 (H) 15 - 37 IU/L    Alkaline Phosphatase 120 40 - 129 IU/L    GFR Non-African American 55 (L) >60 mL/min/1.80m2    GFR African American >60 >60 mL/min/1.38m2    Anion Gap 16 4 - 16   Lactic Acid, Plasma   Result Value Ref Range    Lactate 1.4 0.4 - 2.0 mMOL/L   Urinalysis with Microscopic   Result Value Ref Range    Color, UA YELLOW YELLOW    Clarity, UA SLIGHTLY HAZY (A) CLEAR    Glucose, Urine NEGATIVE NEGATIVE MG/DL    Bilirubin Urine NEGATIVE NEGATIVE MG/DL    Ketones, Urine NEGATIVE NEGATIVE MG/DL    Specific Gravity, UA 1.025 1.001 - 1.035    Blood, Urine MODERATE (A) NEGATIVE    pH, Urine 6.0 5.0 - 8.0    Protein, UA 30 (A) NEGATIVE MG/DL    Urobilinogen, Urine 0.2 0.2 - 1.0 MG/DL    Nitrite Urine, Quantitative NEGATIVE NEGATIVE    Leukocyte Esterase, Urine SMALL (A) NEGATIVE    Volume, (UVOL) 12 10 - 12 ML    RBC, UA 6 TO 9 0 - 3 /HPF    WBC, UA 3 TO 9 0 - 2 /HPF    Epi Cells NO CELLS SEEN /HPF    Cast Type NO CAST FORMS SEEN NO CAST FORMS SEEN /HPF    Bacteria, UA FEW (A) NEGATIVE /HPF    Crystal Type NONE SEEN NEGATIVE /HPF    Mucus, UA NEGATIVE NEGATIVE HPF           Radiographs (  if obtained):  []  The following radiograph was interpreted by myself in the absence of a radiologist:  [x]  Radiologist's Report reviewed at time of ED visit:  CT ABDOMEN PELVIS W IV  CONTRAST   Final Result   Striated nephrograms bilaterally, with surrounding inflammatory changes,   consistent with pyelonephritis in the appropriate clinical setting.      Differential diagnosis would include renal infarcts.         XR CHEST PORTABLE   Final Result   1. No active pulmonary disease.             ED Course and MDM:  In the emergency department the patient patient presents to the emergency department with elevated white blood cell count and fever.  His urine does not appear to be terribly infected although his CT scan is consistent with pyelonephritis.  Zosyn has been ordered for this patient.  He has been accepted in transfer to [place] in [place].  I discussed his care with Dr. Lurena Nida who is accepted this patient in transfer.  The patient will be transferred once a unit is available to transport him to the [place]    Final Impression:  1. Sepsis without acute organ dysfunction, due to unspecified organism (HCC)    2. Acute febrile illness      DISPOSITION Decision To Transfer    Patient referred to:  No follow-up provider specified.  Discharge medications:  New Prescriptions    No medications on file     (Please note that portions of this note may have been completed with a voice recognition program. Efforts were made to edit the dictations but occasionally words are mis-transcribed.)    Brayton Layman, DO, FACEP  Board certified in Emergency Medicine  [employer]        Brayton Layman, DO  07/20/18 1920

## 2018-07-23 LAB — CULTURE, URINE: Total Colony Count: >100000

## 2018-07-25 LAB — CULTURE BLOOD #1: Culture: NO GROWTH

## 2018-07-25 LAB — CULTURE, BLOOD 2: Culture: NO GROWTH

## 2019-09-10 ENCOUNTER — Inpatient Hospital Stay
Admit: 2019-09-10 | Discharge: 2019-09-16 | Disposition: A | Discharge status: Home / Self Care | Payer: MEDICAID | Source: Ambulatory Visit | Admitting: Internal Medicine

## 2019-09-10 ENCOUNTER — Emergency Department: Admit: 2019-09-10 | Payer: MEDICAID | Primary: Internal Medicine

## 2019-09-10 DIAGNOSIS — G35 Multiple sclerosis: Secondary | ICD-10-CM

## 2019-09-10 LAB — CBC WITH AUTO DIFFERENTIAL
Basophils %: 0.5 % (ref 0–1)
Basophils Absolute: 0.1 10*3/uL
Eosinophils %: 2.2 % (ref 0–3)
Eosinophils Absolute: 0.5 10*3/uL
Hematocrit: 45.7 % (ref 42–52)
Hemoglobin: 15.1 GM/DL (ref 13.5–18.0)
Immature Neutrophil %: 0.5 % — ABNORMAL HIGH (ref 0–0.43)
Lymphocytes %: 11.6 % — ABNORMAL LOW (ref 24–44)
Lymphocytes Absolute: 2.4 10*3/uL
MCH: 28.9 PG (ref 27–31)
MCHC: 33 % (ref 32.0–36.0)
MCV: 87.5 FL (ref 78–100)
MPV: 10.8 FL (ref 7.5–11.1)
Monocytes %: 7.1 % — ABNORMAL HIGH (ref 0–4)
Monocytes Absolute: 1.5 10*3/uL
Platelets: 253 10*3/uL (ref 140–440)
RBC: 5.22 10*6/uL (ref 4.6–6.2)
RDW: 13.4 % (ref 11.7–14.9)
Segs Absolute: 16 10*3/uL
Segs Relative: 78.1 % — ABNORMAL HIGH (ref 36–66)
Total Immature Neutrophil: 0.1 10*3/uL
WBC: 20.4 10*3/uL — ABNORMAL HIGH (ref 4.0–10.5)

## 2019-09-10 LAB — COMPREHENSIVE METABOLIC PANEL W/ REFLEX TO MG FOR LOW K
ALT: 14 U/L (ref 10–40)
AST: 19 IU/L (ref 15–37)
Albumin: 4.3 GM/DL (ref 3.4–5.0)
Alkaline Phosphatase: 121 IU/L (ref 40–129)
Anion Gap: 22 — ABNORMAL HIGH (ref 4–16)
BUN: 15 MG/DL (ref 6–23)
CO2: 16 MMOL/L — ABNORMAL LOW (ref 21–32)
Calcium: 10 MG/DL (ref 8.3–10.6)
Chloride: 101 mMol/L (ref 99–110)
Creatinine: 0.9 MG/DL (ref 0.9–1.3)
GFR African American: >60 mL/min/{1.73_m2} (ref >60)
GFR Non-African American: >60 mL/min/{1.73_m2} (ref >60)
Glucose: 94 MG/DL (ref 70–99)
Potassium: 3.5 MMOL/L (ref 3.5–5.1)
Sodium: 139 MMOL/L (ref 135–145)
Total Bilirubin: 0.3 MG/DL (ref 0.0–1.0)
Total Protein: 7.1 GM/DL (ref 6.4–8.2)

## 2019-09-10 LAB — TROPONIN: Troponin T: <0.01 NG/ML (ref <0.01)

## 2019-09-10 LAB — MAGNESIUM: Magnesium: 2 mg/dl (ref 1.8–2.4)

## 2019-09-10 MED ORDER — CEFTRIAXONE SODIUM 1 G IJ SOLR
1 g | INTRAMUSCULAR | Status: DC
Start: 2019-09-10 — End: 2019-09-11
  Administered 2019-09-11: 1 g via INTRAVENOUS

## 2019-09-10 MED ORDER — SODIUM CHLORIDE 0.9 % IV BOLUS
0.9 % | Freq: Once | INTRAVENOUS | Status: AC
Start: 2019-09-10 — End: 2019-09-10
  Administered 2019-09-10: 22:00:00 1000 mL via INTRAVENOUS

## 2019-09-11 LAB — CBC WITH AUTO DIFFERENTIAL
Basophils %: 0.5 % (ref 0–1)
Basophils Absolute: 0.1 10*3/uL
Eosinophils %: 3.2 % — ABNORMAL HIGH (ref 0–3)
Eosinophils Absolute: 0.4 10*3/uL
Hematocrit: 37.2 % — ABNORMAL LOW (ref 42–52)
Hemoglobin: 12.2 GM/DL — ABNORMAL LOW (ref 13.5–18.0)
Immature Neutrophil %: 0.4 % (ref 0–0.43)
Lymphocytes %: 20.9 % — ABNORMAL LOW (ref 24–44)
Lymphocytes Absolute: 2.8 10*3/uL
MCH: 28.9 PG (ref 27–31)
MCHC: 32.8 % (ref 32.0–36.0)
MCV: 88.2 FL (ref 78–100)
MPV: 10.8 FL (ref 7.5–11.1)
Monocytes %: 10.3 % — ABNORMAL HIGH (ref 0–4)
Monocytes Absolute: 1.4 10*3/uL
Platelets: 228 10*3/uL (ref 140–440)
RBC: 4.22 10*6/uL — ABNORMAL LOW (ref 4.6–6.2)
RDW: 13.2 % (ref 11.7–14.9)
Segs Absolute: 8.5 10*3/uL
Segs Relative: 64.7 % (ref 36–66)
Total Immature Neutrophil: 0.05 10*3/uL
WBC: 13.2 10*3/uL — ABNORMAL HIGH (ref 4.0–10.5)

## 2019-09-11 LAB — URINALYSIS WITH REFLEX TO CULTURE
Bacteria, UA: NEGATIVE /HPF
Bilirubin Urine: NEGATIVE MG/DL
Blood, Urine: NEGATIVE
Cast Type: NONE SEEN /HPF
Crystal Type: NONE SEEN /HPF
Epithelial Cells, UA: 1 /HPF
Glucose, Urine: NEGATIVE MG/DL
Ketones, Urine: NEGATIVE MG/DL
Mucus, UA: NEGATIVE [HPF]
Nitrite Urine, Quantitative: NEGATIVE
Protein, UA: NEGATIVE MG/DL
RBC, UA: NONE SEEN /HPF (ref 0–3)
Specific Gravity, UA: 1.01 (ref 1.001–1.035)
Urobilinogen, Urine: 0.2 MG/DL (ref 0.2–1.0)
WBC, UA: 1 /HPF (ref 0–2)
pH, Urine: 6.5 (ref 5.0–8.0)

## 2019-09-11 LAB — BASIC METABOLIC PANEL W/ REFLEX TO MG FOR LOW K
Anion Gap: 10 (ref 4–16)
BUN: 15 MG/DL (ref 6–23)
CO2: 28 MMOL/L (ref 21–32)
Calcium: 9 MG/DL (ref 8.3–10.6)
Chloride: 104 mMol/L (ref 99–110)
Creatinine: 0.8 MG/DL — ABNORMAL LOW (ref 0.9–1.3)
GFR African American: >60 mL/min/{1.73_m2} (ref >60)
GFR Non-African American: >60 mL/min/{1.73_m2} (ref >60)
Glucose: 97 MG/DL (ref 70–99)
Potassium: 3.2 MMOL/L — ABNORMAL LOW (ref 3.5–5.1)
Sodium: 142 MMOL/L (ref 135–145)

## 2019-09-11 LAB — MAGNESIUM: Magnesium: 1.9 mg/dl (ref 1.8–2.4)

## 2019-09-11 MED ORDER — ACETAMINOPHEN 325 MG PO TABS
325 MG | Freq: Four times a day (QID) | ORAL | Status: DC | PRN
Start: 2019-09-11 — End: 2019-09-16
  Administered 2019-09-14 – 2019-09-16 (×2): 650 mg via ORAL

## 2019-09-11 MED ORDER — VITAMIN D (ERGOCALCIFEROL) 1.25 MG (50000 UT) PO CAPS
1.25 MG (50000 UT) | ORAL | Status: DC
Start: 2019-09-11 — End: 2019-09-16

## 2019-09-11 MED ORDER — LOSARTAN POTASSIUM-HCTZ 50-12.5 MG PO TABS
Freq: Every day | ORAL | Status: DC
Start: 2019-09-11 — End: 2019-09-10

## 2019-09-11 MED ORDER — ENOXAPARIN SODIUM 40 MG/0.4ML SC SOLN
40 | Freq: Every day | SUBCUTANEOUS | Status: DC
Start: 2019-09-11 — End: 2019-09-16
  Administered 2019-09-11 – 2019-09-16 (×6): 40 mg via SUBCUTANEOUS

## 2019-09-11 MED ORDER — NORMAL SALINE FLUSH 0.9 % IV SOLN
0.9 % | Freq: Two times a day (BID) | INTRAVENOUS | Status: DC
Start: 2019-09-11 — End: 2019-09-12
  Administered 2019-09-12: 14:00:00 10 mL via INTRAVENOUS

## 2019-09-11 MED ORDER — ACETAMINOPHEN 650 MG RE SUPP
650 MG | Freq: Four times a day (QID) | RECTAL | Status: DC | PRN
Start: 2019-09-11 — End: 2019-09-16

## 2019-09-11 MED ORDER — ONDANSETRON HCL 4 MG/2ML IJ SOLN
4 MG/2ML | Freq: Four times a day (QID) | INTRAMUSCULAR | Status: DC | PRN
Start: 2019-09-11 — End: 2019-09-16

## 2019-09-11 MED ORDER — SERTRALINE HCL 100 MG PO TABS
100 MG | Freq: Every day | ORAL | Status: DC
Start: 2019-09-11 — End: 2019-09-16
  Administered 2019-09-11 – 2019-09-16 (×6): 100 mg via ORAL

## 2019-09-11 MED ORDER — NORMAL SALINE FLUSH 0.9 % IV SOLN
0.9 | INTRAVENOUS | Status: DC | PRN
Start: 2019-09-11 — End: 2019-09-16
  Administered 2019-09-15: 14:00:00 10 mL via INTRAVENOUS

## 2019-09-11 MED ORDER — FAMOTIDINE 20 MG PO TABS
20 MG | Freq: Every day | ORAL | Status: DC
Start: 2019-09-11 — End: 2019-09-16
  Administered 2019-09-11 – 2019-09-16 (×6): 20 mg via ORAL

## 2019-09-11 MED ORDER — SODIUM CHLORIDE 0.9 % IV SOLN
0.9 | INTRAVENOUS | Status: DC
Start: 2019-09-11 — End: 2019-09-12
  Administered 2019-09-11 (×2): via INTRAVENOUS

## 2019-09-11 MED ORDER — SERTRALINE HCL 50 MG PO TABS
50 MG | Freq: Every day | ORAL | Status: DC
Start: 2019-09-11 — End: 2019-09-10

## 2019-09-11 MED ORDER — OXYBUTYNIN CHLORIDE ER 10 MG PO TB24
10 MG | Freq: Every evening | ORAL | Status: DC
Start: 2019-09-11 — End: 2019-09-16
  Administered 2019-09-12 – 2019-09-16 (×5): 10 mg via ORAL

## 2019-09-11 MED ORDER — CULTURELLE PO CAPS
Freq: Two times a day (BID) | ORAL | Status: DC
Start: 2019-09-11 — End: 2019-09-16
  Administered 2019-09-11 – 2019-09-16 (×11): 1 via ORAL

## 2019-09-11 MED ORDER — TOLTERODINE TARTRATE 2 MG PO TABS
2 MG | Freq: Every day | ORAL | Status: DC
Start: 2019-09-11 — End: 2019-09-10

## 2019-09-11 MED ORDER — METHENAMINE HIPPURATE 1 G PO TABS
1 g | Freq: Every day | ORAL | Status: DC
Start: 2019-09-11 — End: 2019-09-16
  Administered 2019-09-11 – 2019-09-16 (×6): 1 g via ORAL

## 2019-09-11 MED ORDER — PROMETHAZINE HCL 12.5 MG PO TABS
12.5 MG | Freq: Four times a day (QID) | ORAL | Status: DC | PRN
Start: 2019-09-11 — End: 2019-09-16

## 2019-09-11 MED ORDER — DICLOFENAC SODIUM 1 % EX GEL
1 % | Freq: Two times a day (BID) | CUTANEOUS | Status: DC
Start: 2019-09-11 — End: 2019-09-16
  Administered 2019-09-11 – 2019-09-16 (×11): 2 g via TOPICAL

## 2019-09-11 MED ORDER — GABAPENTIN 300 MG PO CAPS
300 MG | Freq: Three times a day (TID) | ORAL | Status: DC
Start: 2019-09-11 — End: 2019-09-15
  Administered 2019-09-11 – 2019-09-15 (×13): 600 mg via ORAL

## 2019-09-11 MED ORDER — POLYETHYLENE GLYCOL 3350 17 G PO PACK
17 g | Freq: Every day | ORAL | Status: DC | PRN
Start: 2019-09-11 — End: 2019-09-16

## 2019-09-11 MED ORDER — BACLOFEN 10 MG PO TABS
10 MG | Freq: Two times a day (BID) | ORAL | Status: DC
Start: 2019-09-11 — End: 2019-09-16
  Administered 2019-09-11 – 2019-09-16 (×11): 10 mg via ORAL

## 2019-09-11 MED ORDER — HYDROCHLOROTHIAZIDE 25 MG PO TABS
25 MG | Freq: Every day | ORAL | Status: DC
Start: 2019-09-11 — End: 2019-09-15
  Administered 2019-09-11 – 2019-09-15 (×5): 12.5 mg via ORAL

## 2019-09-11 MED ORDER — VITAMIN D (ERGOCALCIFEROL) 1.25 MG (50000 UT) PO CAPS
1.25 MG (50000 UT) | ORAL | Status: DC
Start: 2019-09-11 — End: 2019-09-10

## 2019-09-11 MED ORDER — LOSARTAN POTASSIUM 50 MG PO TABS
50 MG | Freq: Every day | ORAL | Status: DC
Start: 2019-09-11 — End: 2019-09-15
  Administered 2019-09-11 – 2019-09-15 (×5): 50 mg via ORAL

## 2019-09-11 MED ORDER — ATORVASTATIN CALCIUM 10 MG PO TABS
10 MG | Freq: Every day | ORAL | Status: DC
Start: 2019-09-11 — End: 2019-09-16
  Administered 2019-09-11 – 2019-09-16 (×6): 10 mg via ORAL

## 2019-09-11 MED ORDER — CARBIDOPA-LEVODOPA 25-100 MG PO TABS
25-100 MG | Freq: Two times a day (BID) | ORAL | Status: DC
Start: 2019-09-11 — End: 2019-09-16
  Administered 2019-09-11 – 2019-09-16 (×11): 1 via ORAL

## 2019-09-11 MED FILL — NORMAL SALINE FLUSH 0.9 % IV SOLN: 0.9 % | INTRAVENOUS | Qty: 10

## 2019-09-11 MED FILL — HYDROCHLOROTHIAZIDE 25 MG PO TABS: 25 mg | ORAL | Qty: 1

## 2019-09-11 MED FILL — CEFTRIAXONE SODIUM 1 G IJ SOLR: 1 g | INTRAMUSCULAR | Qty: 1

## 2019-09-11 MED FILL — GABAPENTIN 300 MG PO CAPS: 300 mg | ORAL | Qty: 2

## 2019-09-11 MED FILL — LOSARTAN POTASSIUM 50 MG PO TABS: 50 mg | ORAL | Qty: 1

## 2019-09-11 MED FILL — CARBIDOPA-LEVODOPA 25-100 MG PO TABS: 25-100 mg | ORAL | Qty: 1

## 2019-09-11 MED FILL — ACID REDUCER MAXIMUM STRENGTH 20 MG PO TABS: 20 mg | ORAL | Qty: 1

## 2019-09-11 MED FILL — DICLOFENAC SODIUM 1 % EX GEL: 1 % | CUTANEOUS | Qty: 100

## 2019-09-11 MED FILL — CULTURELLE PO CAPS: ORAL | Qty: 1

## 2019-09-11 MED FILL — ZOLOFT 100 MG PO TABS: 100 mg | ORAL | Qty: 1

## 2019-09-11 MED FILL — ENOXAPARIN SODIUM 40 MG/0.4ML SC SOLN: 40 MG/0.4ML | SUBCUTANEOUS | Qty: 0.4

## 2019-09-11 MED FILL — BACLOFEN 10 MG PO TABS: 10 mg | ORAL | Qty: 1

## 2019-09-11 MED FILL — ATORVASTATIN CALCIUM 10 MG PO TABS: 10 mg | ORAL | Qty: 1

## 2019-09-12 LAB — CBC WITH AUTO DIFFERENTIAL
Basophils %: 0.5 % (ref 0–1)
Basophils Absolute: 0.1 10*3/uL
Eosinophils %: 1.4 % (ref 0–3)
Eosinophils Absolute: 0.2 10*3/uL
Hematocrit: 36.4 % — ABNORMAL LOW (ref 42–52)
Hemoglobin: 11.9 GM/DL — ABNORMAL LOW (ref 13.5–18.0)
Immature Neutrophil %: 0.7 % — ABNORMAL HIGH (ref 0–0.43)
Lymphocytes %: 19.3 % — ABNORMAL LOW (ref 24–44)
Lymphocytes Absolute: 2.6 10*3/uL
MCH: 28.5 PG (ref 27–31)
MCHC: 32.7 % (ref 32.0–36.0)
MCV: 87.3 FL (ref 78–100)
MPV: 10.8 FL (ref 7.5–11.1)
Monocytes %: 8.7 % — ABNORMAL HIGH (ref 0–4)
Monocytes Absolute: 1.2 10*3/uL
Platelets: 239 10*3/uL (ref 140–440)
RBC: 4.17 10*6/uL — ABNORMAL LOW (ref 4.6–6.2)
RDW: 12.8 % (ref 11.7–14.9)
Segs Absolute: 9.3 10*3/uL
Segs Relative: 69.4 % — ABNORMAL HIGH (ref 36–66)
Total Immature Neutrophil: 0.1 10*3/uL
WBC: 13.4 10*3/uL — ABNORMAL HIGH (ref 4.0–10.5)

## 2019-09-12 LAB — BASIC METABOLIC PANEL W/ REFLEX TO MG FOR LOW K
Anion Gap: 9 (ref 4–16)
BUN: 11 MG/DL (ref 6–23)
CO2: 26 MMOL/L (ref 21–32)
Calcium: 8.7 MG/DL (ref 8.3–10.6)
Chloride: 103 mMol/L (ref 99–110)
Creatinine: 0.7 MG/DL — ABNORMAL LOW (ref 0.9–1.3)
GFR African American: >60 mL/min/{1.73_m2} (ref >60)
GFR Non-African American: >60 mL/min/{1.73_m2} (ref >60)
Glucose: 83 MG/DL (ref 70–99)
Potassium: 3.1 MMOL/L — ABNORMAL LOW (ref 3.5–5.1)
Sodium: 138 MMOL/L (ref 135–145)

## 2019-09-12 LAB — MAGNESIUM: Magnesium: 1.9 mg/dl (ref 1.8–2.4)

## 2019-09-12 MED ORDER — POTASSIUM CHLORIDE CRYS ER 20 MEQ PO TBCR
20 MEQ | Freq: Once | ORAL | Status: AC
Start: 2019-09-12 — End: 2019-09-12
  Administered 2019-09-12: 14:00:00 40 meq via ORAL

## 2019-09-12 MED ORDER — MAGNESIUM OXIDE 400 (240 MG) MG PO TABS
400 (240 Mg) MG | Freq: Once | ORAL | Status: AC
Start: 2019-09-12 — End: 2019-09-12
  Administered 2019-09-12: 14:00:00 400 mg via ORAL

## 2019-09-12 MED FILL — ZOLOFT 100 MG PO TABS: 100 mg | ORAL | Qty: 1

## 2019-09-12 MED FILL — CARBIDOPA-LEVODOPA 25-100 MG PO TABS: 25-100 mg | ORAL | Qty: 1

## 2019-09-12 MED FILL — OXYBUTYNIN CHLORIDE ER 10 MG PO TB24: 10 mg | ORAL | Qty: 1

## 2019-09-12 MED FILL — CULTURELLE PO CAPS: ORAL | Qty: 1

## 2019-09-12 MED FILL — LOSARTAN POTASSIUM 50 MG PO TABS: 50 mg | ORAL | Qty: 1

## 2019-09-12 MED FILL — GABAPENTIN 300 MG PO CAPS: 300 mg | ORAL | Qty: 2

## 2019-09-12 MED FILL — ENOXAPARIN SODIUM 40 MG/0.4ML SC SOLN: 40 MG/0.4ML | SUBCUTANEOUS | Qty: 0.4

## 2019-09-12 MED FILL — HYDROCHLOROTHIAZIDE 25 MG PO TABS: 25 mg | ORAL | Qty: 1

## 2019-09-12 MED FILL — BACLOFEN 10 MG PO TABS: 10 mg | ORAL | Qty: 1

## 2019-09-12 MED FILL — MAGNESIUM OXIDE -MG SUPPLEMENT 400 (240 MG) MG PO TABS: 400 (240 Mg) MG | ORAL | Qty: 1

## 2019-09-12 MED FILL — POTASSIUM CHLORIDE CRYS ER 20 MEQ PO TBCR: 20 meq | ORAL | Qty: 2

## 2019-09-12 MED FILL — ACID REDUCER MAXIMUM STRENGTH 20 MG PO TABS: 20 mg | ORAL | Qty: 1

## 2019-09-12 MED FILL — ATORVASTATIN CALCIUM 10 MG PO TABS: 10 mg | ORAL | Qty: 1

## 2019-09-13 LAB — BASIC METABOLIC PANEL W/ REFLEX TO MG FOR LOW K
Anion Gap: 7 (ref 4–16)
BUN: 8 MG/DL (ref 6–23)
CO2: 30 MMOL/L (ref 21–32)
Calcium: 9.3 MG/DL (ref 8.3–10.6)
Chloride: 105 mMol/L (ref 99–110)
Creatinine: 0.8 MG/DL — ABNORMAL LOW (ref 0.9–1.3)
GFR African American: >60 mL/min/{1.73_m2} (ref >60)
GFR Non-African American: >60 mL/min/{1.73_m2} (ref >60)
Glucose: 90 MG/DL (ref 70–99)
Potassium: 4.4 MMOL/L (ref 3.5–5.1)
Sodium: 142 MMOL/L (ref 135–145)

## 2019-09-13 LAB — EKG 12-LEAD
Atrial Rate: 78 {beats}/min
Diagnosis: NORMAL
P Axis: 56 degrees
P-R Interval: 142 ms
Q-T Interval: 352 ms
QRS Duration: 84 ms
QTc Calculation (Bazett): 401 ms
R Axis: 65 degrees
T Axis: 41 degrees
Ventricular Rate: 78 {beats}/min

## 2019-09-13 MED FILL — GABAPENTIN 300 MG PO CAPS: 300 mg | ORAL | Qty: 2

## 2019-09-13 MED FILL — ACID REDUCER MAXIMUM STRENGTH 20 MG PO TABS: 20 mg | ORAL | Qty: 1

## 2019-09-13 MED FILL — HYDROCHLOROTHIAZIDE 25 MG PO TABS: 25 mg | ORAL | Qty: 1

## 2019-09-13 MED FILL — BACLOFEN 10 MG PO TABS: 10 mg | ORAL | Qty: 1

## 2019-09-13 MED FILL — CARBIDOPA-LEVODOPA 25-100 MG PO TABS: 25-100 mg | ORAL | Qty: 1

## 2019-09-13 MED FILL — ENOXAPARIN SODIUM 40 MG/0.4ML SC SOLN: 40 MG/0.4ML | SUBCUTANEOUS | Qty: 0.4

## 2019-09-13 MED FILL — ZOLOFT 100 MG PO TABS: 100 mg | ORAL | Qty: 1

## 2019-09-13 MED FILL — CULTURELLE PO CAPS: ORAL | Qty: 1

## 2019-09-13 MED FILL — ATORVASTATIN CALCIUM 10 MG PO TABS: 10 mg | ORAL | Qty: 1

## 2019-09-13 MED FILL — LOSARTAN POTASSIUM 50 MG PO TABS: 50 mg | ORAL | Qty: 1

## 2019-09-13 MED FILL — OXYBUTYNIN CHLORIDE ER 10 MG PO TB24: 10 mg | ORAL | Qty: 1

## 2019-09-14 LAB — BASIC METABOLIC PANEL W/ REFLEX TO MG FOR LOW K
Anion Gap: 8 (ref 4–16)
BUN: 12 MG/DL (ref 6–23)
CO2: 29 MMOL/L (ref 21–32)
Calcium: 9.1 MG/DL (ref 8.3–10.6)
Chloride: 103 mMol/L (ref 99–110)
Creatinine: 0.8 MG/DL — ABNORMAL LOW (ref 0.9–1.3)
GFR African American: >60 mL/min/{1.73_m2} (ref >60)
GFR Non-African American: >60 mL/min/{1.73_m2} (ref >60)
Glucose: 93 MG/DL (ref 70–99)
Potassium: 3.5 MMOL/L (ref 3.5–5.1)
Sodium: 140 MMOL/L (ref 135–145)

## 2019-09-14 LAB — MAGNESIUM: Magnesium: 2 mg/dl (ref 1.8–2.4)

## 2019-09-14 MED ORDER — POTASSIUM CHLORIDE CRYS ER 20 MEQ PO TBCR
20 MEQ | Freq: Two times a day (BID) | ORAL | Status: DC
Start: 2019-09-14 — End: 2019-09-16
  Administered 2019-09-14 – 2019-09-16 (×4): 20 meq via ORAL

## 2019-09-14 MED FILL — OXYBUTYNIN CHLORIDE ER 10 MG PO TB24: 10 mg | ORAL | Qty: 1

## 2019-09-14 MED FILL — POTASSIUM CHLORIDE CRYS ER 20 MEQ PO TBCR: 20 meq | ORAL | Qty: 1

## 2019-09-14 MED FILL — CARBIDOPA-LEVODOPA 25-100 MG PO TABS: 25-100 mg | ORAL | Qty: 1

## 2019-09-14 MED FILL — GABAPENTIN 300 MG PO CAPS: 300 mg | ORAL | Qty: 2

## 2019-09-14 MED FILL — LOSARTAN POTASSIUM 50 MG PO TABS: 50 mg | ORAL | Qty: 1

## 2019-09-14 MED FILL — CULTURELLE PO CAPS: ORAL | Qty: 1

## 2019-09-14 MED FILL — HYDROCHLOROTHIAZIDE 25 MG PO TABS: 25 mg | ORAL | Qty: 1

## 2019-09-14 MED FILL — ATORVASTATIN CALCIUM 10 MG PO TABS: 10 mg | ORAL | Qty: 1

## 2019-09-14 MED FILL — ZOLOFT 100 MG PO TABS: 100 mg | ORAL | Qty: 1

## 2019-09-14 MED FILL — BACLOFEN 10 MG PO TABS: 10 mg | ORAL | Qty: 1

## 2019-09-14 MED FILL — ACID REDUCER MAXIMUM STRENGTH 20 MG PO TABS: 20 mg | ORAL | Qty: 1

## 2019-09-14 MED FILL — TYLENOL 325 MG PO TABS: 325 mg | ORAL | Qty: 2

## 2019-09-14 MED FILL — ENOXAPARIN SODIUM 40 MG/0.4ML SC SOLN: 40 MG/0.4ML | SUBCUTANEOUS | Qty: 0.4

## 2019-09-14 NOTE — Progress Notes (Signed)
Hospitalist Progress Note      Name:  Cory Nelson DOB/Age/Sex: 05/27/1976  (43 y.o. male)   MRN & CSN:  [id number] & [id number] Admission Date/Time: 09/10/2019  1:41 PM   Location:  009/009-01 PCP: No primary care provider on file.         Hospital Day: 5    Assessment and Plan:   Cory Nelson is a 43 y.o.  male  who presents with Physical debility    1. Debility: History of MS x15 years. Patient working with PT/OT.  Case management working on hospital equipment for home.  Anticipate 1 more day prior to discharge when equipment delivered to home.    2. Hypertension: Controlled 134/89 prior to medications this a.m. We will continue losartan/hydrochlorothiazide 50-12.5 mg daily. Continue to monitor    3. MS: Methenamine hippurate 1 g p.o. daily. Prescription states 1 g p.o. twice daily. Confirmed with father patient takes 1 g in the a.m. only. Prescription changed to same.    4. Hyperlipidemia: Continue statin    5. GERD: Continue PPI    6. Urinary/fecal incontinence: Straight cath every 8 hours. Depends briefs    7. Hypokalemia: Resolved 3.5.    Diet DIET GENERAL;   DVT Prophylaxis [x]  Lovenox, []   Heparin, []  SCDs, [] No VTE prophylaxis, patient ambulating   GI Prophylaxis []  PPI, []  H2 Blocker, []  No GI prophylaxis, patient is receiving diet/Tube Feeds   Code Status Full Code   Disposition Patient requires continued admission due to awaiting hospital equipment for home   MDM []  Low, [x]  Moderate,[]   High  Patient's risk as above due to as above     History of Present Illness:     Pt S&E.  No acute events overnight.  Patient resting comfortably.  Wondering when he can go home.  Denies any headache blurred vision or dizziness denies any chest pain palpitation shortness of breath.  Denies any fever chills nausea or vomiting.  Denies any new muscle or joint ache.  Denies fatigue    10-14 point ROS reviewed negative, unless as noted above    Objective:       Intake/Output Summary (Last 24 hours)  at 09/14/2019 1648  Last data filed at 09/14/2019 1556  Gross per 24 hour   Intake 960 ml   Output 1125 ml   Net -165 ml      Vitals:   Vitals:    09/14/19 0757   BP: 134/89   Pulse: 70   Resp: 16   Temp: 96.8 F (36 C)   SpO2: 95%     Physical Exam:    GEN    Awake male, standing by wheelchair with nursing assistance in no apparent distress. Appears given age.  EYES   Pupils are equally round.  No scleral erythema, discharge, or conjunctivitis.  HENT  Mucous membranes are moist.   NECK  No apparent thyromegaly or masses.  RESP  Clear to auscultation, no wheezes, rales or rhonchi.  Symmetric chest movement while on room air.  CARDIO/VASC           S1/S2 auscultated. Regular rate without appreciable murmurs, rubs, or gallops. Peripheral pulses equal bilaterally and palpable. No peripheral edema.  GI        Abdomen is soft without significant tenderness, masses, or guarding. Bowel sounds are normoactive. Rectal exam deferred.   GU       Foley catheter is not present.  HEME/LYMPH  No petechiae or ecchymoses.  MSK    No gross joint deformities. Spontaneous movement of upper extremities decreased range of motion in the lower extremities.  Good strength in the upper extremities  SKIN    Normal coloration, warm, dry.  NEURO           Cranial nerves appear grossly intact, baseline speech, no lateralizing weakness.  PSYCH            Awake, alert, oriented x 4.  Affect appropriate.    Medications:   Medications:   . methenamine  1 g Oral Daily   . atorvastatin  10 mg Oral Daily   . baclofen  10 mg Oral BID   . carbidopa-levodopa  1 tablet Oral BID   . gabapentin  600 mg Oral TID   . enoxaparin  40 mg Subcutaneous Daily   . famotidine  20 mg Oral Daily   . oxybutynin  10 mg Oral Nightly   . sertraline  100 mg Oral Daily   . diclofenac sodium  2 g Topical BID   . lactobacillus  1 capsule Oral BID WC   . losartan  50 mg Oral Daily    And   . hydroCHLOROthiazide  12.5 mg Oral Daily   . [START ON 09/17/2019] vitamin D   50,000 Units Oral Weekly      Infusions:   PRN Meds:   .  sodium chloride flush, 10 mL, PRN    .  promethazine, 12.5 mg, Q6H PRN    Or    .  ondansetron, 4 mg, Q6H PRN    .  polyethylene glycol, 17 g, Daily PRN    .  acetaminophen, 650 mg, Q6H PRN    Or    .  acetaminophen, 650 mg, Q6H PRN          Electronically signed by Verdene Lennert, MD on 09/14/2019 at 4:48 PM

## 2019-09-15 MED ORDER — LOSARTAN POTASSIUM 50 MG PO TABS
50 MG | Freq: Every day | ORAL | Status: DC
Start: 2019-09-15 — End: 2019-09-16
  Administered 2019-09-16: 14:00:00 50 mg via ORAL

## 2019-09-15 MED ORDER — HYDROCHLOROTHIAZIDE 12.5 MG PO CAPS
12.5 MG | Freq: Every day | ORAL | Status: DC
Start: 2019-09-15 — End: 2019-09-16
  Administered 2019-09-16: 14:00:00 12.5 mg via ORAL

## 2019-09-15 MED ORDER — GABAPENTIN 600 MG PO TABS
600 MG | Freq: Three times a day (TID) | ORAL | Status: DC
Start: 2019-09-15 — End: 2019-09-16
  Administered 2019-09-15 – 2019-09-16 (×4): 600 mg via ORAL

## 2019-09-15 MED FILL — CULTURELLE PO CAPS: ORAL | Qty: 1

## 2019-09-15 MED FILL — LOSARTAN POTASSIUM 50 MG PO TABS: 50 mg | ORAL | Qty: 1

## 2019-09-15 MED FILL — POTASSIUM CHLORIDE CRYS ER 20 MEQ PO TBCR: 20 meq | ORAL | Qty: 1

## 2019-09-15 MED FILL — HYDROCHLOROTHIAZIDE 25 MG PO TABS: 25 mg | ORAL | Qty: 1

## 2019-09-15 MED FILL — ZOLOFT 100 MG PO TABS: 100 mg | ORAL | Qty: 1

## 2019-09-15 MED FILL — GABAPENTIN 300 MG PO CAPS: 300 mg | ORAL | Qty: 2

## 2019-09-15 MED FILL — ATORVASTATIN CALCIUM 10 MG PO TABS: 10 mg | ORAL | Qty: 1

## 2019-09-15 MED FILL — BACLOFEN 10 MG PO TABS: 10 mg | ORAL | Qty: 1

## 2019-09-15 MED FILL — CARBIDOPA-LEVODOPA 25-100 MG PO TABS: 25-100 mg | ORAL | Qty: 1

## 2019-09-15 MED FILL — ENOXAPARIN SODIUM 40 MG/0.4ML SC SOLN: 40 MG/0.4ML | SUBCUTANEOUS | Qty: 0.4

## 2019-09-15 MED FILL — ACID REDUCER MAXIMUM STRENGTH 20 MG PO TABS: 20 mg | ORAL | Qty: 1

## 2019-09-15 MED FILL — GABAPENTIN 600 MG PO TABS: 600 mg | ORAL | Qty: 1

## 2019-09-15 MED FILL — OXYBUTYNIN CHLORIDE ER 10 MG PO TB24: 10 mg | ORAL | Qty: 1

## 2019-09-15 NOTE — Progress Notes (Signed)
Hospitalist Progress Note      Name:  Cory Nelson DOB/Age/Sex: 10-12-1975  (43 y.o. male)   MRN & CSN:  [id number] & [id number] Admission Date/Time: 09/10/2019  1:41 PM   Location:  009/009-01 PCP: No primary care provider on file.         Hospital Day: 6    Assessment and Plan:   Cory Nelson is a 43 y.o.  male  who presents with Physical debility    1.  Debility: History of MS.  Awaiting for hospital equipment to be delivered to patient's home for continued care.  Plan to be delivered tomorrow between 9 and 12 PM.  Father requested the patient be discharged after 3 so he can familiarize himself and trained on the equipment prior to arrival.    2.  Hypertension controlled continue home medications    3.  MS we will continue methenamine hippurate 1 g p.o. daily.    4.  Hyperlipidemia controlled continue statin    5.  GERD continue PPI    6.  Hypokalemia resolved    This time waiting for hospital equipment to be delivered to the patient can return home continue his normal life.  Otherwise medically stable at this time logistical issue    Diet DIET GENERAL;   DVT Prophylaxis []  Lovenox, []   Heparin, []  SCDs, [] No VTE prophylaxis, patient ambulating   GI Prophylaxis []  PPI, []  H2 Blocker, []  No GI prophylaxis, patient is receiving diet/Tube Feeds   Code Status Full Code   Disposition Patient requires continued admission due to logistical issue   MDM []  Low, [x]  Moderate,[]   High  Patient's risk as above due to conditions as above     History of Present Illness:     Pt S&E.  No acute events overnight.  Patient resting company.  Denies any headache blurred vision or dizziness.  Denies any chest pain palpitation shortness of breath.  Denies any fever or chills or nausea or vomiting.  Yesterday attempted to stand up without assistance and slid to the ground without injuring himself did not strike his head no injuries reported patient denies any injury    10-14 point ROS reviewed negative, unless as noted  above    Objective:       Intake/Output Summary (Last 24 hours) at 09/15/2019 1447  Last data filed at 09/15/2019 0900  Gross per 24 hour   Intake 960 ml   Output 1225 ml   Net -265 ml      Vitals:   Vitals:    09/15/19 0753   BP:    Pulse:    Resp:    Temp:    SpO2: 92%     Physical Exam:    GEN Awake male, sitting upright in bed in no apparent distress. Appears given age.  EYES Pupils are equally round.  No scleral erythema, discharge, or conjunctivitis.  HENT Mucous membranes are moist.   NECK No apparent thyromegaly or masses.  RESP Clear to auscultation, no wheezes, rales or rhonchi.  Symmetric chest movement while on room air.  CARDIO/VASC S1/S2 auscultated. Regular rate without appreciable murmurs, rubs, or gallops. Peripheral pulses equal bilaterally and palpable. No peripheral edema.  GI Abdomen is soft without significant tenderness, masses, or guarding. Bowel sounds are normoactive. Rectal exam deferred.   GU Foley catheter is not present.  HEME/LYMPH No petechiae or ecchymoses.  MSK No gross joint deformities. Spontaneous movement of all upper extremities, contracted limited range of motion lower extremities  SKIN Normal coloration, warm, dry.  NEURO Cranial nerves appear grossly intact, normal speech, no lateralizing weakness.  PSYCH Awake, alert, oriented x 3.  Affect appropriate.    Medications:   Medications:   . gabapentin  600 mg Oral TID   . [START ON 09/16/2019] losartan  50 mg Oral Daily    And   . [START ON 09/16/2019] hydroCHLOROthiazide  12.5 mg Oral Daily   . potassium chloride  20 mEq Oral BID WC   . methenamine  1 g Oral Daily   . atorvastatin  10 mg Oral Daily   . baclofen  10 mg Oral BID   . carbidopa-levodopa  1 tablet Oral BID   . enoxaparin  40 mg Subcutaneous Daily   . famotidine  20 mg Oral Daily   . oxybutynin  10 mg Oral Nightly   . sertraline  100 mg Oral Daily   . diclofenac sodium  2 g Topical BID   . lactobacillus  1 capsule Oral BID WC   . [START ON 09/17/2019] vitamin D   50,000 Units Oral Weekly      Infusions:   PRN Meds:   .  sodium chloride flush, 10 mL, PRN    .  promethazine, 12.5 mg, Q6H PRN    Or    .  ondansetron, 4 mg, Q6H PRN    .  polyethylene glycol, 17 g, Daily PRN    .  acetaminophen, 650 mg, Q6H PRN    Or    .  acetaminophen, 650 mg, Q6H PRN          Electronically signed by Verdene Lennert, MD on 09/15/2019 at 2:47 PM

## 2019-09-16 MED FILL — HYDROCHLOROTHIAZIDE 12.5 MG PO CAPS: 12.5 mg | ORAL | Qty: 1

## 2019-09-16 MED FILL — CARBIDOPA-LEVODOPA 25-100 MG PO TABS: 25-100 mg | ORAL | Qty: 1

## 2019-09-16 MED FILL — TYLENOL 325 MG PO TABS: 325 mg | ORAL | Qty: 2

## 2019-09-16 MED FILL — POTASSIUM CHLORIDE CRYS ER 20 MEQ PO TBCR: 20 meq | ORAL | Qty: 1

## 2019-09-16 MED FILL — OXYBUTYNIN CHLORIDE ER 10 MG PO TB24: 10 mg | ORAL | Qty: 1

## 2019-09-16 MED FILL — ACID REDUCER MAXIMUM STRENGTH 20 MG PO TABS: 20 mg | ORAL | Qty: 1

## 2019-09-16 MED FILL — LOSARTAN POTASSIUM 50 MG PO TABS: 50 mg | ORAL | Qty: 1

## 2019-09-16 MED FILL — ENOXAPARIN SODIUM 40 MG/0.4ML SC SOLN: 40 MG/0.4ML | SUBCUTANEOUS | Qty: 0.4

## 2019-09-16 MED FILL — BACLOFEN 10 MG PO TABS: 10 mg | ORAL | Qty: 1

## 2019-09-16 MED FILL — CULTURELLE PO CAPS: ORAL | Qty: 1

## 2019-09-16 MED FILL — GABAPENTIN 600 MG PO TABS: 600 mg | ORAL | Qty: 1

## 2019-09-16 MED FILL — ZOLOFT 100 MG PO TABS: 100 mg | ORAL | Qty: 1

## 2019-09-16 MED FILL — ATORVASTATIN CALCIUM 10 MG PO TABS: 10 mg | ORAL | Qty: 1

## 2019-09-16 NOTE — Discharge Summary (Signed)
Discharge Summary    Name:  Cory Nelson DOB/Age/Sex: 1975-12-28  (43 y.o. male)   MRN & CSN:  [id number] & [id number] Admission Date/Time: 09/10/2019  1:41 PM   Attending:  Verdene Lennert, MD Discharging Physician: Verdene Lennert, MD     HPI:   43 year old white male presents with fatigue physical disability.  Patient past medical history significant for MS greater than 15 years hypertension hyperlipidemia and GERD.  Father essentially brought patient in on Christmas Day stating he could no longer care for his 22 year old son at home.  Father without appropriate resources and getting on and age in his 20s.  Was also some concern the patient might be having an MS flare.  Patient was admitted to observation for social concerns and weakness and debility.  Please see H&P for full details    Hospital Course:   Cory Nelson is a 43 y.o.  male  who presents with Physical debility    1.  Debility: History of MS.  Awaiting for hospital equipment to be delivered to patient's home for continued care.  Plan to be delivered today, 09/16/19 between 9 and 12 PM.  Father requested the patient be discharged after 3 so he can familiarize himself and trained on the equipment prior to arrival.    2.  Hypertension controlled continue home medications    3.  MS we will continue methenamine hippurate 1 g p.o. daily.    4.  Hyperlipidemia controlled continue statin    5.  GERD continue PPI    6.  Hypokalemia resolved    The patient expressed appropriate understanding of and agreement with the discharge recommendations, medications, and plan.     Consults this admission:  IP CONSULT TO HOSPITALIST  IP CONSULT TO SOCIAL WORK  IP CONSULT TO CASE MANAGEMENT    Discharge Instruction:   Follow up appointments: Neurologist as per schedule  Primary care physician:  within 2 weeks    Diet:  low fat, low cholesterol diet   Activity: activity as tolerated  Disposition: Discharged to:   [x] Home, [] HHC, [] SNF, [] Acute Rehab, [] Hospice      Condition on discharge: Stable    Discharge Medications:      Cory Nelson, Cory Nelson   Home Medication Instructions [id number]    Printed on:09/16/19 1014   Medication Information                      atorvastatin (LIPITOR) 10 MG tablet  Take 10 mg by mouth daily             baclofen (LIORESAL) 10 MG tablet  Take 10 mg by mouth 2 times daily             carbidopa-levodopa (SINEMET) 25-100 MG per tablet  Take 1 tablet by mouth 2 times daily             gabapentin (NEURONTIN) 300 MG capsule  Take 600 mg by mouth 3 times daily.              losartan-hydrochlorothiazide (HYZAAR) 50-12.5 MG per tablet  Take 1 tablet by mouth daily             sertraline (ZOLOFT) 50 MG tablet  Take 50 mg by mouth daily             tolterodine (DETROL) 2 MG tablet  Take 2 mg by mouth daily             vitamin D (ERGOCALCIFEROL) 50000  units CAPS capsule  Take 50,000 Units by mouth once a week                 Objective Findings at Discharge:   BP 133/84   Pulse 63   Temp 97.5 F (36.4 C) (Infrared)   Resp 16   Ht 5\' 6"  (1.676 m)   Wt 161 lb 9.6 oz (73.3 kg)   SpO2 96%   BMI 26.08 kg/m            PHYSICAL EXAM   GEN    Awake male, sitting upright in bed in no apparent distress. Appears given age.  EYES   Pupils are equally round.  No scleral erythema, discharge, or conjunctivitis.  HENT  Mucous membranes are moist.   NECK  No apparent thyromegaly or masses.  RESP  Clear to auscultation, no wheezes, rales or rhonchi.  Symmetric chest movement while on room air.  CARDIO/VASC           S1/S2 auscultated. Regular rate without appreciable murmurs, rubs, or gallops. Peripheral pulses equal bilaterally and palpable. No peripheral edema.  GI        Abdomen is soft without significant tenderness, masses, or guarding. Bowel sounds are normoactive. Rectal exam deferred.   GU       Foley catheter is not present.  HEME/LYMPH            No petechiae or ecchymoses.  MSK    No gross joint deformities. Spontaneous movement of all upper extremities,  contracted limited range of motion lower extremities  SKIN    Normal coloration, warm, dry.  NEURO           Cranial nerves appear grossly intact, normal speech, no lateralizing weakness.  PSYCH            Awake, alert, oriented x 3.  Affect appropriate.    BMP/CBC  Recent Labs     09/14/19  0600   NA 140   K 3.5   CL 103   CO2 29   BUN 12   CREATININE 0.8*       IMAGING: Chest x-ray on intake:  FINDINGS:   The lungs are without acute focal process. There is no effusion or   pneumothorax. The cardiomediastinal silhouette is without acute process. The   osseous structures are without acute process.         Impression   No acute process.        CT of the head without contrast on intake:  FINDINGS:   BRAIN/VENTRICLES: There is no acute intracranial hemorrhage, mass effect or   midline shift. No abnormal extra-axial fluid collection. The gray-white   differentiation is maintained without evidence of an acute infarct. There is   no evidence of hydrocephalus. Moderate central greater than cortical volume   loss and moderate chronic periventricular and subcortical white matter   hypoattenuation.      ORBITS: The visualized portion of the orbits demonstrate no acute abnormality.      SINUSES: The visualized paranasal sinuses and mastoid air cells demonstrate   no acute abnormality.      SOFT TISSUES/SKULL: No acute abnormality of the visualized skull or soft   tissues.         Impression   1. No acute intracranial abnormality.   2. Moderate central greater than cortical volume loss and moderate   nonspecific chronic periventricular and subcortical white matter   hypoattenuation.  Discharge Time of 20 minutes    Electronically signed by Verdene Lennert, MD on 09/16/2019 at 10:14 AM

## 2019-12-07 ENCOUNTER — Inpatient Hospital Stay
Admit: 2019-12-07 | Discharge: 2019-12-07 | Disposition: A | Discharge status: Home / Self Care | Payer: MEDICAID | Attending: Emergency Medicine

## 2019-12-07 DIAGNOSIS — K648 Other hemorrhoids: Secondary | ICD-10-CM

## 2019-12-07 NOTE — Discharge Instructions (Signed)
Thank you for allowing us to be involved in your care today.  Follow up as discussed during your stay. This information is included in your discharge paperwork.  Appropriate followup is essential in your continued care after today's visit. If you had any diagnostic studies ( Labs or Xray's, CAT scan, Ultrasound ) have your PCP (Primary Care Physician) review them with you since there may be results that require further follow up or investigation.    Return to the Emergency Department at any point with worsening condition or concerns.    The physician and staff of the Emergency Department would like to thank you for choosing our facility for your health care needs.  Our goal is to provide exceptional service.  You may be receiving a survey in the mail following your visit.  Because your feedback is very important to us, we hope you will take the time to complete and return the survey.  If for any reason, you feel that you cannot rate us "VERY GOOD" or "5" for the service you received today, please let us know prior to your discharge.    Please follow up with your family doctor or one of your choosing.  You may find a provider through [facility], or the [facility] in [place] may be helpful at [phone], [place], Thank You for choosing [facility]        We are here 24 hours a day, 7 days a week, and are always here for you.      Dr. Aaryan Essman A. Monnie Anspach  Board Certified  Emergency Medicine  Clinical Professor

## 2019-12-08 NOTE — Telephone Encounter (Signed)
Pt scheduled

## 2019-12-08 NOTE — Telephone Encounter (Signed)
-----   Message from Irineo Axon, MD sent at 12/07/2019  5:14 PM EDT -----  Please call the patient to schedule an ED follow up appointment for hemorrhoids and rectal bleeding.     Thanks!    Dr. Lyn Hollingshead     ----- Message -----  From: Avelino Leeds, DO  Sent: 12/07/2019  11:34 AM EDT  To: Irineo Axon, MD

## 2019-12-08 NOTE — Telephone Encounter (Signed)
Left message

## 2019-12-10 ENCOUNTER — Encounter: Attending: Surgery | Primary: Internal Medicine

## 2019-12-14 ENCOUNTER — Ambulatory Visit: Admit: 2019-12-14 | Discharge: 2019-12-14 | Payer: MEDICAID | Attending: Surgery | Primary: Internal Medicine

## 2019-12-14 DIAGNOSIS — K641 Second degree hemorrhoids: Secondary | ICD-10-CM

## 2019-12-14 MED ORDER — LIDOCAINE HCL 3 % EX CREA
3 % | CUTANEOUS | 0 refills | Status: DC
Start: 2019-12-14 — End: 2019-12-31

## 2019-12-14 NOTE — Progress Notes (Signed)
GENERAL SURGERY OUTPATIENT HISTORY & PHYSICAL NOTE  [facility]    PATIENT: Cory Nelson, Cory Nelson, 25-Jul-1976, 45 y.o., male  MRN: [id number]    Physician: Irineo Axon, MD    Date: 12/14/19    Reason for Evaluation:     Chief Complaint   Patient presents with   ??? New Patient     NP rectal bleeding, hemorrhoids ED 12/07/19     Requesting Physician:  ED Referral    CHIEF COMPLAINT:     Chief Complaint   Patient presents with   ??? New Patient     NP rectal bleeding, hemorrhoids ED 12/07/19     History Obtained From:  patient, electronic medical record    HISTORY OF PRESENT ILLNESS:    Cory Nelson is a 44 y.o. male presenting with concern for rectal bleeding and hemorrhoids. He had an episode of rectal bleeding for which he was evaluated in the ED on 12/07/19. No previous episodes of rectal bleeding.  The hemorrhoids are uncomfortable when he is sitting - dull aching pain. Denies other modifying factors. He is wheelchair bound due to his MS. He is unsure how long the hemorrhoids have been present.   Denies noticing hemorrhoids prolapse with bowel movements.   He has chronic constipation, has a BM about every 2-3 days. Denies hard stools. He has tried fiber supplements, but they weren't helping much so he stopped taking them. Occasionally takes colace.     He also has a perianal area which has been draining trace amounts of seropurulent fluid for about 6 months. He denies history of an abscess.     He had a colonoscopy a little more than a year ago and had hemorrhoids, no other findings (he doesn't remember who did it).      FH of colon cancer in his grandmother, was dx in her mid 49's.     Past Medical History:    Past Medical History:   Diagnosis Date   ??? GERD (gastroesophageal reflux disease)    ??? Hyperlipidemia    ??? Hypertension    ??? MS (multiple sclerosis) (H[facility])        Past Surgical History:    Past Surgical History:   Procedure Laterality Date   ??? COLONOSCOPY     ??? UPPER GASTROINTESTINAL ENDOSCOPY          Current Medications:   Current Outpatient Medications   Medication Sig Dispense Refill   ??? lidocaine (LIDAMANTLE) 3 % CREA Apply small amount topically to anal area three times daily as needed for pain. 28 g 0   ??? atorvastatin (LIPITOR) 10 MG tablet Take 10 mg by mouth daily     ??? carbidopa-levodopa (SINEMET) 25-100 MG per tablet Take 1 tablet by mouth 2 times daily     ??? baclofen (LIORESAL) 10 MG tablet Take 10 mg by mouth 2 times daily     ??? vitamin D (ERGOCALCIFEROL) 50000 units CAPS capsule Take 50,000 Units by mouth once a week     ??? gabapentin (NEURONTIN) 300 MG capsule Take 600 mg by mouth 3 times daily.      ??? tolterodine (DETROL) 2 MG tablet Take 2 mg by mouth daily     ??? sertraline (ZOLOFT) 50 MG tablet Take 50 mg by mouth daily     ??? losartan-hydrochlorothiazide (HYZAAR) 50-12.5 MG per tablet Take 1 tablet by mouth daily       No current facility-administered medications for this visit.  Allergies:  Patient has no known allergies.    Social History:   Social History     Socioeconomic History   ??? Marital status: Single     Spouse name: None   ??? Number of children: None   ??? Years of education: None   ??? Highest education level: None   Occupational History   ??? None   Social Needs   ??? Financial resource strain: None   ??? Food insecurity     Worry: None     Inability: None   ??? Transportation needs     Medical: None     Non-medical: None   Tobacco Use   ??? Smoking status: Former Smoker     Packs/day: 1.00     Types: Cigarettes     Quit date: 09/10/2019     Years since quitting: 0.2   ??? Smokeless tobacco: Never Used   Substance and Sexual Activity   ??? Alcohol use: No   ??? Drug use: Not Currently     Types: Marijuana   ??? Sexual activity: Never   Lifestyle   ??? Physical activity     Days per week: None     Minutes per session: None   ??? Stress: None   Relationships   ??? Social Wellsite geologist on phone: None     Gets together: None     Attends religious service: None     Active member of club or  organization: None     Attends meetings of clubs or organizations: None     Relationship status: None   ??? Intimate partner violence     Fear of current or ex partner: None     Emotionally abused: None     Physically abused: None     Forced sexual activity: None   Other Topics Concern   ??? None   Social History Narrative   ??? None       Family History:   Family History   Problem Relation Age of Onset   ??? Other Mother        REVIEW OF SYSTEMS:    Review of Systems   Constitutional: Negative for chills and fever.   HENT: Negative for congestion and sore throat.    Eyes: Negative for discharge and redness.   Respiratory: Negative for cough, chest tightness and shortness of breath.    Cardiovascular: Negative for chest pain and palpitations.   Gastrointestinal: Positive for anal bleeding, constipation and rectal pain. Negative for abdominal distention, abdominal pain, diarrhea, nausea and vomiting.   Genitourinary: Negative for dysuria and flank pain.   Musculoskeletal: Positive for back pain and gait problem (wheelchair bound due to MS). Negative for arthralgias and myalgias.   Skin: Positive for wound. Negative for color change and rash.   Neurological: Positive for weakness. Negative for dizziness and numbness.   Psychiatric/Behavioral: Negative for confusion. The patient is not nervous/anxious.      I have reviewed the patient's information pertinent to this visit, including medical history, family history, social history and review of systems.    PHYSICAL EXAM:    Vitals:    12/14/19 1017   BP: 124/76   Pulse: 64   Temp: 98.3 ??F (36.8 ??C)   SpO2: 100%   Weight: 155 lb 1.6 oz (70.4 kg)   Height: 5\' 8"  (1.727 m)     Physical Exam  Constitutional:       General: He is not in acute distress.  Appearance: He is well-developed. He is not diaphoretic.   HENT:      Head: Normocephalic and atraumatic.      Mouth/Throat:      Mouth: Mucous membranes are moist.   Eyes:      General:         Right eye: No discharge.          Left eye: No discharge.      Pupils: Pupils are equal, round, and reactive to light.   Neck:      Musculoskeletal: Neck supple.      Trachea: No tracheal deviation.   Cardiovascular:      Rate and Rhythm: Normal rate and regular rhythm.   Pulmonary:      Effort: Pulmonary effort is normal. No respiratory distress.      Breath sounds: No wheezing.   Abdominal:      General: There is no distension.      Palpations: Abdomen is soft.      Tenderness: There is no abdominal tenderness. There is no guarding or rebound.   Genitourinary:     Rectum: External hemorrhoid (tiny external hemorrhoidal skin tags, no thrombosis or bleeding) and internal hemorrhoid present. No mass, tenderness or anal fissure. Normal anal tone.       Musculoskeletal:         General: No tenderness or deformity.   Skin:     General: Skin is warm and dry.      Findings: No rash.   Neurological:      Mental Status: He is alert and oriented to person, place, and time.   Psychiatric:         Behavior: Behavior normal.         DATA:    Lab Results   Component Value Date    WBC 13.4 (H) 09/12/2019    HGB 11.9 (L) 09/12/2019    HCT 36.4 (L) 09/12/2019    PLT 239 09/12/2019    NA 140 09/14/2019    K 3.5 09/14/2019    CL 103 09/14/2019    CO2 29 09/14/2019    BUN 12 09/14/2019    CREATININE 0.8 (L) 09/14/2019    GLUCOSE 93 09/14/2019    CALCIUM 9.1 09/14/2019    PROT 7.1 09/10/2019    BILITOT 0.3 09/10/2019    AST 19 09/10/2019    ALT 14 09/10/2019    ALKPHOS 121 09/10/2019       Imaging:   No results found.  Pertinent laboratory and imaging studies were personally reviewed if available.    IMPRESSION:    Remmington Urieta is a 44 y.o. male with rectal bleeding and discomfort, symptomatic hemorrhoids, and a small perianal fistula    1. Grade II hemorrhoids    2. Rectal bleeding    3. Perianal fistula    4. MS (multiple sclerosis) ([place])    5. Encounter for preoperative screening laboratory testing for COVID-19 virus      Patient Active Problem List    Diagnosis  Date Noted   ??? MS (multiple sclerosis) ([place])    ??? Hyperlipidemia    ??? Hypertension    ??? GERD (gastroesophageal reflux disease)    ??? Physical debility 09/10/2019     PLAN:  ?? Discussed findings and options with Lauris Chroman and his father at bedside.  ?? The rectal bleeding appears consistent with hemorrhoidal bleeding. He also has discomfort from sitting for long periods. Discussed medical management for the hemorrhoids including avoiding constipation,  staying hydrated, high fiber diet, and stool softeners or laxatives as needed. He will try these things as well, but we will also plan on surgery given he is worried about having another episode of bleeding and has a fistula also.   ?? The perianal fistula has not resolved over at least 6 months, so will plan on REUA with possible fistulotomy  Planned procedure: rectal examination under anesthesia with stapled hemorrhoidectomy, possible external hemorrhoidectomy, and anal fistulotomy  The risks, benefits, potential complications and possible alternatives of the procedure were discussed in detail, including complications of but not limited to infection, bleeding, anesthesia-related complications, death, incontinence, recurrent fistula or hemorrhoids, or need for additional interventions. All questions were answered. The patient wished to proceed and consent was documented in the medical record.   PAT anticipated: yes (has weakness with his MS but denies respiratory compromise), COVID testing  Informed consent: obtained  Anticipated status: Outpatient  Anticipated location:  [facility]   The patient was counseled at length about the risks of contracting Covid-19 during their perioperative period and any recovery window from their procedure.  The patient was made aware that contracting Covid-19  may worsen their prognosis for recovering from their procedure  and lend to a higher morbidity and/or mortality risk.  All material risks, benefits, and reasonable alternatives  including postponing the procedure were discussed. The patient does wish to proceed with the procedure at this time.  Follow Up: Return for for pre-admission testing and surgery.    Orders Placed This Encounter   Procedures   ??? Case Request   ??? COVID-19 Ambulatory   ??? Initiate PAT Protocol        Orders Placed This Encounter   Medications   ??? lidocaine (LIDAMANTLE) 3 % CREA     Sig: Apply small amount topically to anal area three times daily as needed for pain.     Dispense:  28 g     Refill:  0         Electronically signed by Irineo Axon, MD, 12/15/2019, 11:14 AM

## 2019-12-17 NOTE — Telephone Encounter (Signed)
Pt notified

## 2019-12-17 NOTE — Telephone Encounter (Signed)
LEFT MESSAGE FOR Cory Nelson REGARDING SURGERY (RECTAL EUA WITH PPH, EXT. HEMM AND ANAL FISTULOTOMY) SCHEDULED @ [facility]. NOTIFIED OF DATES, TIMES AND LOCATION    PHONE ASSESSMENT /PAT - =12/31/19 @ 130  COVID - AFTER PAT  SURGERY - 01/07/20 @ 1130  P/O - 01/18/20 @ 1000    NPO AFTER MIDNIGHT  HOLD BLOOD THINNERS - 5 DAYS PRIOR IF TAKING ANY

## 2019-12-29 ENCOUNTER — Encounter

## 2019-12-31 ENCOUNTER — Inpatient Hospital Stay: Admit: 2019-12-31 | Payer: MEDICAID | Primary: Internal Medicine

## 2019-12-31 DIAGNOSIS — Z01812 Encounter for preprocedural laboratory examination: Secondary | ICD-10-CM

## 2019-12-31 LAB — CBC WITH AUTO DIFFERENTIAL
Basophils %: 1.1 % — ABNORMAL HIGH (ref 0–1)
Basophils Absolute: 0.1 10*3/uL
Eosinophils %: 2.8 % (ref 0–3)
Eosinophils Absolute: 0.3 10*3/uL
Hematocrit: 42.6 % (ref 42–52)
Hemoglobin: 14.3 GM/DL (ref 13.5–18.0)
Immature Neutrophil %: 0.6 % — ABNORMAL HIGH (ref 0–0.43)
Lymphocytes %: 27 % (ref 24–44)
Lymphocytes Absolute: 2.4 10*3/uL
MCH: 29.9 PG (ref 27–31)
MCHC: 33.6 % (ref 32.0–36.0)
MCV: 89.1 FL (ref 78–100)
MPV: 10.7 FL (ref 7.5–11.1)
Monocytes %: 9.7 % — ABNORMAL HIGH (ref 0–4)
Monocytes Absolute: 0.9 10*3/uL
Nucleated RBC %: 0 %
Platelets: 306 10*3/uL (ref 140–440)
RBC: 4.78 10*6/uL (ref 4.6–6.2)
RDW: 12.8 % (ref 11.7–14.9)
Segs Absolute: 5.3 10*3/uL
Segs Relative: 58.8 % (ref 36–66)
Total Immature Neutrophil: 0.05 10*3/uL
Total Nucleated RBC: 0 10*3/uL
WBC: 9 10*3/uL (ref 4.0–10.5)

## 2019-12-31 LAB — BASIC METABOLIC PANEL
Anion Gap: 11 (ref 4–16)
BUN: 17 MG/DL (ref 6–23)
CO2: 28 MMOL/L (ref 21–32)
Calcium: 9.9 MG/DL (ref 8.3–10.6)
Chloride: 101 mMol/L (ref 99–110)
Creatinine: 0.9 MG/DL (ref 0.9–1.3)
GFR African American: >60 mL/min/{1.73_m2} (ref >60)
GFR Non-African American: >60 mL/min/{1.73_m2} (ref >60)
Glucose: 78 MG/DL (ref 70–99)
Potassium: 4.2 MMOL/L (ref 3.5–5.1)
Sodium: 140 MMOL/L (ref 135–145)

## 2019-12-31 LAB — COVID-19: SARS-CoV-2: NOT DETECTED

## 2020-01-06 NOTE — Anesthesia Pre-Procedure Evaluation (Signed)
Department of Anesthesiology  Preprocedure Note       Name:  Cory Nelson   Age:  44 y.o.  DOB:  10-24-1975                                          MRN:  [id number]         Date:  01/06/2020      Surgeon: Juliann Mule):  Loyal Jacobson, MD    Procedure: Procedure(s):  HEMORRHOIDECTOMY PPH, RECTAL EXAM UNDER ANESTHESIA, POSSIBLE EXTERNAL HEMORRHOIDECTOMY, POSSIBLE ANAL FISTULOTOMY    Medications prior to admission:   Prior to Admission medications    Medication Sig Start Date End Date Taking? Authorizing Provider   pantoprazole (PROTONIX) 40 MG tablet Take 40 mg by mouth daily    Historical Provider, MD   methenamine (HIPREX) 1 g tablet Take 1 g by mouth daily    Historical Provider, MD   oxybutynin (DITROPAN-XL) 10 MG extended release tablet Take 10 mg by mouth daily    Historical Provider, MD   vitamin B-12 (CYANOCOBALAMIN) 1000 MCG tablet Take 1,000 mcg by mouth daily    Historical Provider, MD   atorvastatin (LIPITOR) 10 MG tablet Take 10 mg by mouth nightly     Historical Provider, MD   baclofen (LIORESAL) 10 MG tablet Take 20 mg by mouth 3 times daily  08/23/16   Historical Provider, MD   vitamin D (ERGOCALCIFEROL) 50000 units CAPS capsule Take 50,000 Units by mouth once a week 05/05/17   Historical Provider, MD   gabapentin (NEURONTIN) 300 MG capsule Take 600 mg by mouth 3 times daily.  07/08/16   Historical Provider, MD   sertraline (ZOLOFT) 50 MG tablet Take 100 mg by mouth daily     Historical Provider, MD   losartan-hydrochlorothiazide (HYZAAR) 50-12.5 MG per tablet Take 1 tablet by mouth daily    Historical Provider, MD       Current medications:    No current facility-administered medications for this encounter.      Current Outpatient Medications   Medication Sig Dispense Refill   . pantoprazole (PROTONIX) 40 MG tablet Take 40 mg by mouth daily     . methenamine (HIPREX) 1 g tablet Take 1 g by mouth daily     . oxybutynin (DITROPAN-XL) 10 MG extended release tablet Take 10 mg by mouth daily     .  vitamin B-12 (CYANOCOBALAMIN) 1000 MCG tablet Take 1,000 mcg by mouth daily     . atorvastatin (LIPITOR) 10 MG tablet Take 10 mg by mouth nightly      . baclofen (LIORESAL) 10 MG tablet Take 20 mg by mouth 3 times daily      . vitamin D (ERGOCALCIFEROL) 50000 units CAPS capsule Take 50,000 Units by mouth once a week     . gabapentin (NEURONTIN) 300 MG capsule Take 600 mg by mouth 3 times daily.      . sertraline (ZOLOFT) 50 MG tablet Take 100 mg by mouth daily      . losartan-hydrochlorothiazide (HYZAAR) 50-12.5 MG per tablet Take 1 tablet by mouth daily         Allergies:  No Known Allergies    Problem List:    Patient Active Problem List   Diagnosis Code   . Physical debility R53.81   . MS (multiple sclerosis) ([place]) G35   . Hyperlipidemia E78.5   .  Hypertension I10   . GERD (gastroesophageal reflux disease) K21.9   . Grade II hemorrhoids K64.1   . Rectal bleeding K62.5   . Perianal fistula K60.3       Past Medical History:        Diagnosis Date   . 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 GERD (gastroesophageal reflux disease)    . Hyperlipidemia    . Hypertension    . MS (multiple sclerosis) ([place])     "dx with MS age 13, follow Dr Lambert Keto  at [facility]""currently unable to walk at all"   . Spinal stenosis     hx per old chart- chronic low back pain       Past Surgical History:        Procedure Laterality Date   . COLONOSCOPY  2020   . UPPER GASTROINTESTINAL ENDOSCOPY         Social History:    Social History     Tobacco Use   . Smoking status: Former Smoker     Packs/day: 1.50     Years: 25.00     Pack years: 37.50     Types: Cigarettes     Quit date: 09/10/2019     Years since quitting: 0.3   . Smokeless tobacco: Never Used   Substance Use Topics   . Alcohol use: No     Comment: very rarely- one time per year"use to drink beer every day but that was over 10 yrs" per pt on 12/31/2019                                Counseling given: Not Answered      Vital Signs (Current):  There were no vitals filed for this visit.                                           BP Readings from Last 3 Encounters:   12/31/19 112/81   12/14/19 124/76   12/07/19 (!) 140/73       NPO Status:                                                                                 BMI:   Wt Readings from Last 3 Encounters:   12/14/19 155 lb 1.6 oz (70.4 kg)   12/07/19 161 lb (73 kg)   09/10/19 161 lb 9.6 oz (73.3 kg)     There is no height or weight on file to calculate BMI.    CBC:   Lab Results   Component Value Date    WBC 9.0 12/31/2019    RBC 4.78 12/31/2019    HGB 14.3 12/31/2019    HCT 42.6 12/31/2019    MCV 89.1 12/31/2019    RDW 12.8 12/31/2019    PLT 306 12/31/2019       CMP:   Lab Results   Component Value Date    NA 140  12/31/2019    K 4.2 12/31/2019    CL 101 12/31/2019    CO2 28 12/31/2019    BUN 17 12/31/2019    CREATININE 0.9 12/31/2019    GFRAA >60 12/31/2019    LABGLOM >60 12/31/2019    GLUCOSE 78 12/31/2019    PROT 7.1 09/10/2019    CALCIUM 9.9 12/31/2019    BILITOT 0.3 09/10/2019    ALKPHOS 121 09/10/2019    AST 19 09/10/2019    ALT 14 09/10/2019       POC Tests: No results for input(s): POCGLU, POCNA, POCK, POCCL, POCBUN, POCHEMO, POCHCT in the last 72 hours.    Coags: No results found for: PROTIME, INR, APTT    HCG (If Applicable): No results found for: PREGTESTUR, PREGSERUM, HCG, HCGQUANT     ABGs: No results found for: PHART, PO2ART, PCO2ART, HCO3ART, BEART, O2SATART     Type & Screen (If Applicable):  No results found for: LABABO, LABRH    Drug/Infectious Status (If Applicable):  No results found for: HIV, HEPCAB    COVID-19 Screening (If Applicable):   Lab Results   Component Value Date    COVID19 NOT DETECTED 12/31/2019           Anesthesia Evaluation  Patient summary reviewed  Airway:         Dental:          Pulmonary:                             ROS comment: Former Smoker - 37.5 pack years  Quit Smoking: 09/10/19       Cardiovascular:    (+) hypertension:, hyperlipidemia         Beta  Blocker:  Not on Beta Blocker         Neuro/Psych:   (+) neuromuscular disease: multiple sclerosis,             GI/Hepatic/Renal:   (+) GERD:,          ROS comment: Grade II hemorrhoids  Rectal bleeding  Perianal fistula    .   Endo/Other: Negative Endo/Other ROS                    Abdominal:           Vascular: negative vascular ROS.                                       Anesthesia Plan      general     ASA 3       Induction: intravenous.                          Glennie Hawk, APRN - CRNA   01/06/2020         Pre Anesthesia Assessment complete. Chart reviewed on 01/06/2020

## 2020-01-06 NOTE — Progress Notes (Signed)
01/06/20 -Notified surgery on 01/07/20 @ 1130, arrival 0930. Understanding verbalized.

## 2020-01-07 ENCOUNTER — Inpatient Hospital Stay: Payer: MEDICAID

## 2020-01-07 MED ORDER — DEXAMETHASONE SODIUM PHOSPHATE 4 MG/ML IJ SOLN
4 | INTRAMUSCULAR | Status: AC
Start: 2020-01-07 — End: 2020-01-07

## 2020-01-07 MED ORDER — HYDROCODONE-ACETAMINOPHEN 5-325 MG PO TABS
5-325 MG | ORAL | Status: DC | PRN
Start: 2020-01-07 — End: 2020-01-07

## 2020-01-07 MED ORDER — CEFAZOLIN 2000 MG IN D5W 100 ML IVPB
Status: AC
Start: 2020-01-07 — End: 2020-01-07
  Administered 2020-01-07: 16:00:00 2 mg via INTRAVENOUS

## 2020-01-07 MED ORDER — BUPIVACAINE LIPOSOME 1.3 % IJ SUSP
1.3 | INTRAMUSCULAR | Status: AC
Start: 2020-01-07 — End: 2020-01-07

## 2020-01-07 MED ORDER — METRONIDAZOLE IN NACL 5-0.79 MG/ML-% IV SOLN
5 mg/mL | INTRAVENOUS | Status: DC
Start: 2020-01-07 — End: 2020-01-07

## 2020-01-07 MED ORDER — MIDAZOLAM HCL 2 MG/2ML IJ SOLN
2 | INTRAMUSCULAR | Status: AC
Start: 2020-01-07 — End: 2020-01-07

## 2020-01-07 MED ORDER — ONDANSETRON HCL 4 MG/2ML IJ SOLN
4 MG/2ML | Freq: Once | INTRAMUSCULAR | Status: DC | PRN
Start: 2020-01-07 — End: 2020-01-07

## 2020-01-07 MED ORDER — FENTANYL CITRATE (PF) 100 MCG/2ML IJ SOLN
100 MCG/2ML | INTRAMUSCULAR | Status: DC | PRN
Start: 2020-01-07 — End: 2020-01-07
  Administered 2020-01-07: 16:00:00 100 via INTRAVENOUS

## 2020-01-07 MED ORDER — LIDOCAINE HCL (PF) 2 % IJ SOLN
2 % | INTRAMUSCULAR | Status: DC | PRN
Start: 2020-01-07 — End: 2020-01-07
  Administered 2020-01-07: 16:00:00 2 via INTRAVENOUS

## 2020-01-07 MED ORDER — LIDOCAINE (CARDIAC) 100 MG/5ML IV SOLN (MIXTURES ONLY)
100 | INTRAVENOUS | Status: AC
Start: 2020-01-07 — End: 2020-01-07

## 2020-01-07 MED ORDER — DIPHENHYDRAMINE HCL 50 MG/ML IJ SOLN
50 MG/ML | Freq: Once | INTRAMUSCULAR | Status: DC | PRN
Start: 2020-01-07 — End: 2020-01-07

## 2020-01-07 MED ORDER — LACTATED RINGERS IV SOLN
INTRAVENOUS | Status: DC
Start: 2020-01-07 — End: 2020-01-07
  Administered 2020-01-07 (×3): via INTRAVENOUS

## 2020-01-07 MED ORDER — GELATIN ABSORBABLE 100 EX MISC
100 | CUTANEOUS | Status: AC
Start: 2020-01-07 — End: 2020-01-07

## 2020-01-07 MED ORDER — BUPIVACAINE LIPOSOME 1.3 % IJ SUSP
1.3 % | Freq: Once | INTRAMUSCULAR | Status: AC | PRN
Start: 2020-01-07 — End: 2020-01-07
  Administered 2020-01-07: 18:00:00 20 via SUBCUTANEOUS

## 2020-01-07 MED ORDER — EPHEDRINE SULFATE (PRESSORS) 50 MG/ML IV SOLN
50 MG/ML | INTRAVENOUS | Status: DC | PRN
Start: 2020-01-07 — End: 2020-01-07
  Administered 2020-01-07 (×3): 10 via INTRAVENOUS

## 2020-01-07 MED ORDER — EPHEDRINE SULFATE (PRESSORS) 50 MG/ML IV SOLN
50 | INTRAVENOUS | Status: AC
Start: 2020-01-07 — End: 2020-01-07

## 2020-01-07 MED ORDER — LIDOCAINE HCL (PF) 2 % IJ SOLN
2 | INTRAMUSCULAR | Status: AC
Start: 2020-01-07 — End: 2020-01-07

## 2020-01-07 MED ORDER — SODIUM CHLORIDE 0.9 % IV SOLN
0.9 % | Freq: Once | INTRAVENOUS | Status: AC | PRN
Start: 2020-01-07 — End: 2020-01-07
  Administered 2020-01-07: 17:00:00 30

## 2020-01-07 MED ORDER — PROPOFOL 200 MG/20ML IV EMUL
200 | INTRAVENOUS | Status: AC
Start: 2020-01-07 — End: 2020-01-07

## 2020-01-07 MED ORDER — DEXAMETHASONE SODIUM PHOSPHATE 4 MG/ML IJ SOLN
4 MG/ML | INTRAMUSCULAR | Status: DC | PRN
Start: 2020-01-07 — End: 2020-01-07
  Administered 2020-01-07: 16:00:00 8 via INTRAVENOUS

## 2020-01-07 MED ORDER — LIDOCAINE (CARDIAC) 100 MG/5ML IV SOLN (MIXTURES ONLY)
100 MG/5ML | INTRAVENOUS | Status: DC | PRN
Start: 2020-01-07 — End: 2020-01-07
  Administered 2020-01-07: 16:00:00 20 via INTRAVENOUS

## 2020-01-07 MED ORDER — CEFAZOLIN SODIUM-DEXTROSE 2-3 GM-%(50ML) IV SOLR
2000 mg in Dextrose 3% | INTRAVENOUS | Status: DC | PRN
Start: 2020-01-07 — End: 2020-01-07
  Administered 2020-01-07: 16:00:00 2000 via INTRAVENOUS

## 2020-01-07 MED ORDER — ONDANSETRON HCL 4 MG/2ML IJ SOLN
4 | INTRAMUSCULAR | Status: AC
Start: 2020-01-07 — End: 2020-01-07

## 2020-01-07 MED ORDER — FENTANYL CITRATE (PF) 100 MCG/2ML IJ SOLN
100 MCG/2ML | INTRAMUSCULAR | Status: DC | PRN
Start: 2020-01-07 — End: 2020-01-07

## 2020-01-07 MED ORDER — PROPOFOL 200 MG/20ML IV EMUL
200 MG/20ML | INTRAVENOUS | Status: DC | PRN
Start: 2020-01-07 — End: 2020-01-07
  Administered 2020-01-07: 16:00:00 120 via INTRAVENOUS

## 2020-01-07 MED ORDER — LABETALOL HCL 5 MG/ML IV SOLN
5 | INTRAVENOUS | Status: DC | PRN
Start: 2020-01-07 — End: 2020-01-07

## 2020-01-07 MED ORDER — POLYETHYLENE GLYCOL 3350 17 GM/SCOOP PO POWD
17 GM/SCOOP | Freq: Every day | ORAL | 0 refills | Status: AC
Start: 2020-01-07 — End: 2020-02-06

## 2020-01-07 MED ORDER — HYDROCODONE-ACETAMINOPHEN 5-325 MG PO TABS
5-325 MG | ORAL_TABLET | Freq: Four times a day (QID) | ORAL | 0 refills | Status: AC | PRN
Start: 2020-01-07 — End: 2020-01-14

## 2020-01-07 MED ORDER — ONDANSETRON HCL 4 MG/2ML IJ SOLN
4 MG/2ML | INTRAMUSCULAR | Status: DC | PRN
Start: 2020-01-07 — End: 2020-01-07
  Administered 2020-01-07: 16:00:00 4 via INTRAVENOUS

## 2020-01-07 MED ORDER — SODIUM CHLORIDE 0.9 % IV BOLUS
0.9 % | Freq: Once | INTRAVENOUS | Status: DC | PRN
Start: 2020-01-07 — End: 2020-01-07

## 2020-01-07 MED ORDER — DOCUSATE SODIUM 100 MG PO CAPS
100 MG | ORAL_CAPSULE | Freq: Two times a day (BID) | ORAL | 0 refills | Status: AC
Start: 2020-01-07 — End: 2020-01-21

## 2020-01-07 MED ORDER — PROMETHAZINE HCL 25 MG/ML IJ SOLN
25 MG/ML | Freq: Once | INTRAMUSCULAR | Status: DC | PRN
Start: 2020-01-07 — End: 2020-01-07

## 2020-01-07 MED ORDER — NORMAL SALINE FLUSH 0.9 % IV SOLN
0.9 | INTRAVENOUS | Status: AC
Start: 2020-01-07 — End: 2020-01-07

## 2020-01-07 MED ORDER — FENTANYL CITRATE (PF) 100 MCG/2ML IJ SOLN
100 | INTRAMUSCULAR | Status: AC
Start: 2020-01-07 — End: 2020-01-07

## 2020-01-07 MED ORDER — HYDROCODONE-ACETAMINOPHEN 5-325 MG PO TABS
5-325 MG | Freq: Four times a day (QID) | ORAL | Status: DC | PRN
Start: 2020-01-07 — End: 2020-01-07

## 2020-01-07 MED ORDER — HYDRALAZINE HCL 20 MG/ML IJ SOLN
20 MG/ML | INTRAMUSCULAR | Status: DC | PRN
Start: 2020-01-07 — End: 2020-01-07

## 2020-01-07 MED ORDER — NORMAL SALINE FLUSH 0.9 % IV SOLN
0.9 % | Freq: Two times a day (BID) | INTRAVENOUS | Status: DC
Start: 2020-01-07 — End: 2020-01-07

## 2020-01-07 MED ORDER — KETOROLAC TROMETHAMINE 30 MG/ML IJ SOLN
30 | INTRAMUSCULAR | Status: AC
Start: 2020-01-07 — End: 2020-01-07

## 2020-01-07 MED ORDER — NORMAL SALINE FLUSH 0.9 % IV SOLN
0.9 % | INTRAVENOUS | Status: DC | PRN
Start: 2020-01-07 — End: 2020-01-07

## 2020-01-07 MED ORDER — MEPERIDINE HCL 25 MG/ML IJ SOLN
25 MG/ML | INTRAMUSCULAR | Status: DC | PRN
Start: 2020-01-07 — End: 2020-01-07

## 2020-01-07 MED ORDER — KETOROLAC TROMETHAMINE 30 MG/ML IJ SOLN
30 MG/ML | INTRAMUSCULAR | Status: DC | PRN
Start: 2020-01-07 — End: 2020-01-07
  Administered 2020-01-07: 18:00:00 30 via INTRAVENOUS

## 2020-01-07 MED ORDER — HYDROMORPHONE HCL 2 MG/ML IJ SOLN
2 MG/ML | INTRAMUSCULAR | Status: DC | PRN
Start: 2020-01-07 — End: 2020-01-07

## 2020-01-07 MED ORDER — MUPIROCIN 2 % EX OINT
2 | CUTANEOUS | Status: AC
Start: 2020-01-07 — End: 2020-01-07

## 2020-01-07 MED FILL — DIPRIVAN 200 MG/20ML IV EMUL: 200 MG/20ML | INTRAVENOUS | Qty: 20

## 2020-01-07 MED FILL — ONDANSETRON HCL 4 MG/2ML IJ SOLN: 4 MG/2ML | INTRAMUSCULAR | Qty: 2

## 2020-01-07 MED FILL — CEFAZOLIN 2000 MG IN D5W 100 ML IVPB: Qty: 100

## 2020-01-07 MED FILL — NORMAL SALINE FLUSH 0.9 % IV SOLN: 0.9 % | INTRAVENOUS | Qty: 40

## 2020-01-07 MED FILL — EXPAREL 1.3 % IJ SUSP: 1.3 % | INTRAMUSCULAR | Qty: 20

## 2020-01-07 MED FILL — FENTANYL CITRATE (PF) 100 MCG/2ML IJ SOLN: 100 MCG/2ML | INTRAMUSCULAR | Qty: 2

## 2020-01-07 MED FILL — LACTATED RINGERS IV SOLN: INTRAVENOUS | Qty: 1000

## 2020-01-07 MED FILL — AKOVAZ 50 MG/ML IV SOLN: 50 mg/mL | INTRAVENOUS | Qty: 1

## 2020-01-07 MED FILL — DEXAMETHASONE SODIUM PHOSPHATE 4 MG/ML IJ SOLN: 4 mg/mL | INTRAMUSCULAR | Qty: 2

## 2020-01-07 MED FILL — GELFOAM SPONGE SIZE 100 EX MISC: CUTANEOUS | Qty: 1

## 2020-01-07 MED FILL — LIDOCAINE HCL (PF) 2 % IJ SOLN: 2 % | INTRAMUSCULAR | Qty: 5

## 2020-01-07 MED FILL — KETOROLAC TROMETHAMINE 30 MG/ML IJ SOLN: 30 mg/mL | INTRAMUSCULAR | Qty: 1

## 2020-01-07 MED FILL — LIDOCAINE HCL (CARDIAC) 100 MG/5ML IV SOSY: 100 MG/5ML | INTRAVENOUS | Qty: 5

## 2020-01-07 MED FILL — MIDAZOLAM HCL 2 MG/2ML IJ SOLN: 2 mg/mL | INTRAMUSCULAR | Qty: 2

## 2020-01-07 MED FILL — METRONIDAZOLE IN NACL 5-0.79 MG/ML-% IV SOLN: 5 mg/mL | INTRAVENOUS | Qty: 100

## 2020-01-07 MED FILL — MUPIROCIN 2 % EX OINT: 2 % | CUTANEOUS | Qty: 22

## 2020-01-07 NOTE — Discharge Instructions (Signed)
Postoperative Instructions:  The following instructions will help you care for yourself or be cared for upon your return home.  These are guidelines for your care after surgery only.    Anesthesia Precautions & Expectations:   After anesthesia, rest for 24 hours.  Do not drive, drink alcoholic beverages or make any important decisions during this time.     Anesthesia may cause a sore throat, jaw discomfort or muscle aches.  These symptoms can last for one or two days.    You may feel tired for several days.    Diet:   Eat slowly and small amounts at a time to recognize when full.    After your anesthesia has worn off, increase your diet slowly to a high fiber diet   Avoid high sugar and fatty foods   Drink plenty of fluids--approx. 48-64 oz a day unless instructed to restrict fluids by your doctor    Medications:   Take your medications as instructed   Do not drive after taking prescription pain medicine.    Pain medications are meant to be taken as needed for postoperative pain. It is normal to have some pain or soreness after surgery. This will usually decrease over a few days. Gradually wean your use of these prescription medications and transition to over the counter medications.   Narcotic pain medications can cause constipation. Take an over-the-counter stool softener such as Colace, MiraLax, or Milk of Magnesia daily according to manufacturer instructions while you're taking narcotic pain medications to help prevent constipation.   Eat when taking prescription pain medication because they cause nausea on an empty stomach.   Over-the-counter (OTC) pain medication  o Advil/ibuprofen/Motrin, 200 mg tablets: you can take up to 2 tablets every 4-6 hours as needed.  Do not use these if you have stomach problems like ulcers or if you are on blood thinners.  o You may also take Tylenol /Acetaminophen: 2 Extra-strength tablets every 4-6 hours as needed.  Caution: Percocet and Vicodin containTylenol  /Acetaminophen. DO NOT TAKE MORE THAN 3000 mg total of Tylenol /Acetaminophen in 24 hours as this can cause liver damage.    Lifestyle:   Do not smoke. Smoking will increase your risk for developing ulcers and other post-op complications, and impairs wound healing.   Avoid alcohol   At least 30 minutes of continuous activity daily is encouraged.    Walking is encouraged beginning a few hours after your surgery. Gradually increase the distance, length of time, and the number of times you walk.    Climbing stairs is allowed.   Being active helps to avoid blood clots in your legs and pneumonia.    Incision care:  You have a lidocaine covered dissolvable foam pad in the rectum. It will pass with your first bowel movement after surgery, so you may notice some white coloration or blood in your stools for a few days. A high fiber diet and over-the-counter stool softeners are recommended to help keep your bowel movements soft. Drink plenty of water. Sitz baths can be used for comfort. Keep the your incision(s) clean and dry, cleaning after each bowel movement and at least twice per day. You have sutures which are absorbable and will not need to be removed.   The anal fistula site was left open to allow for secondary healing. Flush out the opening in the skin with water when showering daily and if soiled with bowel movements.    You may apply ice packs to your incision  for pain control, 20 minutes on and 20 minutes off.   Call the surgery office if you notice increasing redness or drainage from your incision(s), or if you start having fevers or increasing pain at your incision(s) that does not get better with medication      Call your surgeon [phone]):   Persistent vomiting not related to overeating   Vomit looks bloody, black or like coffee grounds   Inability to keep anything down for >24 hours   Severe and worsening pain not improved with medication or rest   You do not have a bowel movement for > 3  days.   Increased redness, swelling, bleeding or drainage at the incision site   Temperature above 101 degrees    Difficulty urinating            [facility]  [phone]    Do not drive, work around machines or use equipment.  Do not drink any alcoholic beverages.  Do not smoke while alone.  Avoid making important decisions.  Plan to spend a quiet, relaxed evening @ home.  Resume normal activities as you begin to feel better.  Eat lightly for your first meal, then gradually increase your diet to what is normal for you.  In case of nausea, avoid food and drink only clear liquids.  Resume food as nausea ceases.  Notify your surgeon if you experience fever, chills, large amount of bleeding, difficulty breathing, persistent nausea and vomiting or any other disturbing problem.  Call for a follow-up appointment with your surgeon.     Advance Care Planning  People with COVID-19 may have no symptoms, mild symptoms, such as fever, cough, and shortness of breath or they may have more severe illness, developing severe and fatal pneumonia.  As a result, Advance Care Planning with attention to naming a health care decision maker (someone you trust to make healthcare decisions for you if you could not speak for yourself) and sharing other health care preferences is important BEFORE a possible health crisis. Please contact your Primary Care Provider to discuss Advance Care Planning.    Preventing the Spread of Coronavirus Disease 2019 in Homes and Residential Communities  For the most recent information go to [email or web address]    Prevention steps for People with confirmed or suspected COVID-19 (including persons under investigation) who do not need to be hospitalized  and   People with confirmed COVID-19 who were hospitalized and determined to be medically stable to go home    Your healthcare provider and public health staff will evaluate whether  you can be cared for at home. If it is determined that you do not need to be hospitalized and can be isolated at home, you will be monitored by staff from your local or state health department. You should follow the prevention steps below until a healthcare provider or local or state health department says you can return to your normal activities.  Stay home except to get medical care  People who are mildly ill with COVID-19 are able to isolate at home during their illness. You should restrict activities outside your home, except for getting medical care. Do not go to work, school, or public areas. Avoid using public transportation, ride-sharing, or taxis.  Separate yourself from other people and animals in your home  People: As much as possible, you should stay in a specific room and away from other people in your home. Also, you should use a separate bathroom, if available.  Animals:  You should restrict contact with pets and other animals while you are sick with COVID-19, just like you would around other people. Although there have not been reports of pets or other animals becoming sick with COVID-19, it is still recommended that people sick with COVID-19 limit contact with animals until more information is known about the virus. When possible, have another member of your household care for your animals while you are sick. If you are sick with COVID-19, avoid contact with your pet, including petting, snuggling, being kissed or licked, and sharing food. If you must care for your pet or be around animals while you are sick, wash your hands before and after you interact with pets and wear a facemask.  Call ahead before visiting your doctor  If you have a medical appointment, call the healthcare provider and tell them that you have or may have COVID-19. This will help the healthcare provider's office take steps to keep other people from getting infected or exposed.  Wear a facemask  You should wear a facemask when you  are around other people (e.g., sharing a room or vehicle) or pets and before you enter a healthcare provider's office. If you are not able to wear a facemask (for example, because it causes trouble breathing), then people who live with you should not stay in the same room with you, or they should wear a facemask if they enter your room.  Cover your coughs and sneezes  Cover your mouth and nose with a tissue when you cough or sneeze. Throw used tissues in a lined trash can. Immediately wash your hands with soap and water for at least 20 seconds or, if soap and water are not available, clean your hands with an alcohol-based hand sanitizer that contains at least 60% alcohol.  Clean your hands often  Wash your hands often with soap and water for at least 20 seconds, especially after blowing your nose, coughing, or sneezing; going to the bathroom; and before eating or preparing food. If soap and water are not readily available, use an alcohol-based hand sanitizer with at least 60% alcohol, covering all surfaces of your hands and rubbing them together until they feel dry.  Soap and water are the best option if hands are visibly dirty. Avoid touching your eyes, nose, and mouth with unwashed hands.  Avoid sharing personal household items  You should not share dishes, drinking glasses, cups, eating utensils, towels, or bedding with other people or pets in your home. After using these items, they should be washed thoroughly with soap and water.  Clean all "high-touch" surfaces everyday  High touch surfaces include counters, tabletops, doorknobs, bathroom fixtures, toilets, phones, keyboards, tablets, and bedside tables. Also, clean any surfaces that may have blood, stool, or body fluids on them. Use a household cleaning spray or wipe, according to the label instructions. Labels contain instructions for safe and effective use of the cleaning product including precautions you should take when applying the product, such as  wearing gloves and making sure you have good ventilation during use of the product.  Monitor your symptoms  Seek prompt medical attention if your illness is worsening (e.g., difficulty breathing). Before seeking care, call your healthcare provider and tell them that you have, or are being evaluated for, COVID-19. Put on a facemask before you enter the facility. These steps will help the healthcare provider's office to keep other people in the office or waiting room from getting infected or exposed. Ask your healthcare provider  to call the local or state health department. Persons who are placed under active monitoring or facilitated self-monitoring should follow instructions provided by their local health department or occupational health professionals, as appropriate. When working with your local health department check their available hours.  If you have a medical emergency and need to call 911, notify the dispatch personnel that you have, or are being evaluated for COVID-19. If possible, put on a facemask before emergency medical services arrive.  Discontinuing home isolation  Patients with confirmed COVID-19 should remain under home isolation precautions until the risk of secondary transmission to others is thought to be low. The decision to discontinue home isolation precautions should be made on a case-by-case basis, in consultation with healthcare providers and state and local health departments.

## 2020-01-07 NOTE — Progress Notes (Signed)
1500  Pt back to Same Day from PACU per cart--skin pink, W&D. Report received from [facility].  VS rechecked and stable. Pt denies any acute pain or nausea.   1545  Pt states is ready to go home.  VS rechecked and remain stable. Patient and his father instructed for discharge care and follow-up and use of all 3 Rx with understanding voiced.  IV dc'd and father is helping pt get dressed to go home.   1600  Pt out to car at exit per his own wheelchair.  Assisted to get in car for ride home. Given ABD pads to wear in underwear in case of drainage from rectal surgery and given sitz bath to use at home

## 2020-01-07 NOTE — Brief Op Note (Signed)
GENERAL SURGERY BRIEF OPERATIVE NOTE  [facility]    PATIENT: Cory Nelson 08-Mar-1976   MRN: [id number]    DATE: 01/07/2020    SURGEON: Irineo Axon, MD    CASE ID: [id number]     PROCEDURE(S) PERFORMED:      HEMORRHOIDECTOMY PPH, RECTAL EXAM UNDER ANESTHESIA, EXTERNAL HEMORRHOIDECTOMY, AND ANAL FISTULOTOMY: [phone] (CPT??)    PREOPERATIVE DIAGNOSIS:  grade II hemorrhoids, rectal bleeding, perianal fistula    POSTOPERATIVE DIAGNOSIS:   same    INDICATIONS: Cory Nelson is a 44 y.o. male presenting with symptomatic hemorrhoids and a small perianal fistula for which rectal exam under anesthesia with hemorrhoidectomy (stapled and external) and fistulotomy has been discussed. The risks, benefits, potential complications and possible alternatives of the procedure were discussed in detail, including complications of but not limited to infection, bleeding, anesthesia-related complications, death, injury to surrounding structures, pain, recurrence of hemorrhoids or fistula, poor healing, or need for additional interventions. All questions were answered. The patient wished to proceed and consent was documented in the medical record.      FINDINGS:   Internal and external hemorrhoids, small anal fistula    ANESTHESIA:   General endotracheal anesthesia  Anesthesiologist: Dreama Saa, MD  CRNA: Cala Bradford, APRN - CRNA; Lu Duffel, APRN - CRNA; Arlina Robes, APRN - CRNA    STAFF:   Scrub Person First: Lovett Sox, RN    ESTIMATED BLOOD LOSS: Minimal    SPECIMEN(S):   ID Type Source Tests Collected by Time Destination   A : hemorrhoid tissue Specimen Rectum SURGICAL PATHOLOGY Irineo Axon, MD 01/07/2020 1303        DRAINS & IMPLANTS:  * No implants in log *     COMPLICATIONS: none    DISPOSITION: PACU - hemodynamically stable.     CONDITION: stable     Electronically signed:   Irineo Axon, MD 01/07/2020 1:55 PM

## 2020-01-07 NOTE — Progress Notes (Signed)
1403 patient received from the OR monitor placed and alarms on. Report received from [facility] CRNA.   1410 Dr Lyn Hollingshead in to visit bedside and stated for the packing to stay in place until first bowel movement   1445 denies any needs at this time and he is ready to go shopping at [facility]  1458 transferred back to same day surgery via cart. Report given to [facility] at bedside

## 2020-01-07 NOTE — Op Note (Signed)
GENERAL SURGERY OPERATIVE NOTE  [facility]    PATIENT: Cory Nelson 23-May-1976   MRN: [id number]    DATE: 01/07/2020    SURGEON: Irineo Axon, MD    CASE ID: [id number]     PROCEDURE(S) PERFORMED:      HEMORRHOIDECTOMY PPH, RECTAL EXAM UNDER ANESTHESIA, EXTERNAL HEMORRHOIDECTOMY, AND ANAL FISTULOTOMY: [phone] (CPT??)    PREOPERATIVE DIAGNOSIS:  grade II hemorrhoids, rectal bleeding, perianal fistula    POSTOPERATIVE DIAGNOSIS:   same    INDICATIONS: Cory Nelson is a 44 y.o. male presenting with symptomatic hemorrhoids and a small perianal fistula for which rectal exam under anesthesia with hemorrhoidectomy (stapled and external) and fistulotomy has been discussed. The risks, benefits, potential complications and possible alternatives of the procedure were discussed in detail, including complications of but not limited to infection, bleeding, anesthesia-related complications, death, injury to surrounding structures, pain, recurrence of hemorrhoids or fistula, poor healing, or need for additional interventions. All questions were answered. The patient wished to proceed and consent was documented in the medical record.      FINDINGS:   Internal and external hemorrhoids, small anal fistula    ANESTHESIA:   General endotracheal anesthesia  Anesthesiologist: Dreama Saa, MD  CRNA: Cala Bradford, APRN - CRNA; Lu Duffel, APRN - CRNA; Arlina Robes, APRN - CRNA    STAFF:   Scrub Person First: Lovett Sox, RN    First Assistant: Renita Papa, APRN-CNP   The use of a first assistant was necessary for the proper positioning, prepping, and draping of the patient, as well as the safe and expeditious execution of the case and closure of skin and subcutaneous tissues.     ESTIMATED BLOOD LOSS: Minimal    SPECIMEN(S):   ID Type Source Tests Collected by Time Destination   A : hemorrhoid tissue Specimen Rectum SURGICAL PATHOLOGY Irineo Axon, MD 01/07/2020 1303        DRAINS & IMPLANTS:  * No  implants in log *     COMPLICATIONS: none    DISPOSITION: PACU - hemodynamically stable.     CONDITION: stable     PROCEDURE IN DETAIL:  Prior to beginning the procedure, informed consent was obtained and consent was documented in the medical record. The patient was brought to the operating room and positioned supine on the operating table. Anesthesia was initiated and a time out was performed in which all were in agreement. The patient was transitioned to lithotomy position, and all pressure points were carefully padded. Appropriate perioperative antibiotics were administered and the patient was prepped and draped in the usual sterile fashion.     A rectal exam was performed and showed small to medium internal hemorrhoids, 2 moderate sized external hemorrhoids, and a tiny perianal fistula approximately 3cm from the anal verge adjacent to the gluteal fold on the right side. A lubricated anal speculum was inserted into the anus for exposure. The internal hemorrhoids were noted, and a pursestring suture was placed about 3 cm proximal to the anal dentate line using 3-0 Prolene suture. The pursestring was tied around the EEA stapler apparatus of the anvil. This was stapled and removed. Complete donuts were noted to be hemorrhoidal tissue. The staple line was oozing at one point posteriorly and was oversewn with a figure of eight Chromic suture, then was hemostatic. The anal speculum was removed from the anus by cutting the sutures that were used to secure the anal speculum to the perianal skin.  There was excess external hemorrhoidal tissue noted in 2 columns, one posteriorly and one left anteriorly, and these were removed by using Bovie cautery for excision of the excess skin. Then 3-0 chromic gut sutures were used to approximate the skin edges as well as for hemostasis.  Attention was then turned to the perianal fistula. A lacrimal probe was used, but there was no apparent patency with the anus, however a small amount  of seropurulent drainage was noted. An elliptical incision ~1cm was made around the opening in the skin and dissection was carried into the subcutaneous tissues. The fistulous tract was resected for approximately 2cm, and electrocautery was used to cauterize the tract. Hemostasis was achieved and the area was packed with surgicel.     A Gelfoam coated in Mupirocin 2% ointment was inserted into the anus and packed for hemostasis. 85mL Exparel liposomal bupivacaine 1.3% was used for a perirectal block. ABD and mesh panties were applied for final dressing.    The procedure was concluded. The patient tolerated the procedure well with no apparent complications and was transferred to PACU - hemodynamically stable. Counts were reported correct at the end of the case. I was present and primarily performed all critical portions of the case.     Electronically signed:   Irineo Axon, MD 01/07/2020 1:55 PM

## 2020-01-07 NOTE — H&P (Signed)
History and Physical Update    I have seen and examined Cory Nelson on 01/07/2020 and confirmed the planned procedure. There are no changes since the History and Physical note on 12/14/19. All questions have been answered and the patient wishes to proceed and consent was verified in the medical record.    The patient was counseled at length about the risks of contracting Covid-19 during their perioperative period and any recovery window from their procedure.  The patient was made aware that contracting Covid-19  may worsen their prognosis for recovering from their procedure  and lend to a higher morbidity and/or mortality risk.  All material risks, benefits, and reasonable alternatives including postponing the procedure were discussed. The patient does wish to proceed with the procedure at this time.     Ambulatory COVID-19 negative     Electronically signed: Irineo Axon, MD 01/07/2020 12:01 PM  ___________________________________________

## 2020-01-18 ENCOUNTER — Encounter: Attending: Surgery | Primary: Internal Medicine

## 2020-02-01 ENCOUNTER — Ambulatory Visit: Admit: 2020-02-01 | Discharge: 2020-02-01 | Payer: MEDICAID | Attending: Surgery | Primary: Internal Medicine

## 2020-02-01 DIAGNOSIS — K603 Anal fistula: Secondary | ICD-10-CM

## 2020-02-01 NOTE — Progress Notes (Signed)
GENERAL SURGERY OUTPATIENT POSTOPERATIVE NOTE  [facility]    PATIENT: Cory Nelson, Cory Nelson, 04/06/1976, 44 y.o., male    MRN: [id number]    Physician: Irineo Axon, MD    Date: 02/01/20    CHIEF COMPLAINT:     Chief Complaint   Patient presents with   ??? Post-Op Check     Po Rectal EUA with PPH Ext Hemm and Anal Fistulotomy @ [facility] 01/07/20       History Obtained From:  patient, electronic medical record    HISTORY OF PRESENT ILLNESS:      PETRO TALENT is a 44 y.o. male presenting postoperatively after HEMORRHOIDECTOMY PPH, RECTAL EXAM UNDER ANESTHESIA, EXTERNAL HEMORRHOIDECTOMY, AND ANAL FISTULOTOMY: [phone] (CPT??). Since the procedure, he has been doing well overall. He has had some serous drainage from the fistulotomy site, but no purulent or feculent drainage. He has not had any further hemorrhoidal problems or bleeding.     Eating a regular diet without difficulty.   Bowel movement are Normal.    The patient is not having any pain.    Wounds/Incisions: are healing well. No erythema.     Past Medical History:    Past Medical History:   Diagnosis Date   ??? 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 GERD (gastroesophageal reflux disease)    ??? Hyperlipidemia    ??? Hypertension    ??? MS (multiple sclerosis) (HCC)     "dx with MS age 58, follow Dr Theotis Barrio  at [facility]""currently unable to walk at all"   ??? Spinal stenosis     hx per old chart- chronic low back pain       Past Surgical History:    Past Surgical History:   Procedure Laterality Date   ??? COLONOSCOPY  2020   ??? HEMORRHOID SURGERY N/A 01/07/2020    HEMORRHOIDECTOMY PPH, RECTAL EXAM UNDER ANESTHESIA, EXTERNAL HEMORRHOIDECTOMY, AND ANAL FISTULOTOMY performed by Irineo Axon, MD at [facility] OR   ??? UPPER GASTROINTESTINAL ENDOSCOPY         Current Medications:   Current Outpatient Medications   Medication Sig Dispense Refill   ??? polyethylene glycol (GLYCOLAX) 17 GM/SCOOP powder Take 17 g by mouth daily 510 g 0   ???  pantoprazole (PROTONIX) 40 MG tablet Take 40 mg by mouth daily     ??? methenamine (HIPREX) 1 g tablet Take 1 g by mouth daily     ??? oxybutynin (DITROPAN-XL) 10 MG extended release tablet Take 10 mg by mouth daily     ??? vitamin B-12 (CYANOCOBALAMIN) 1000 MCG tablet Take 1,000 mcg by mouth daily     ??? atorvastatin (LIPITOR) 10 MG tablet Take 10 mg by mouth nightly      ??? baclofen (LIORESAL) 10 MG tablet Take 20 mg by mouth 3 times daily      ??? vitamin D (ERGOCALCIFEROL) 50000 units CAPS capsule Take 50,000 Units by mouth once a week     ??? gabapentin (NEURONTIN) 300 MG capsule Take 600 mg by mouth 3 times daily.      ??? sertraline (ZOLOFT) 50 MG tablet Take 100 mg by mouth daily      ??? losartan-hydrochlorothiazide (HYZAAR) 50-12.5 MG per tablet Take 1 tablet by mouth daily       No current facility-administered medications for this visit.       Allergies:  Patient has no known allergies.  Social History:   Social History     Socioeconomic History   ??? Marital status: Single     Spouse name: None   ??? Number of children: None   ??? Years of education: None   ??? Highest education level: None   Occupational History   ??? None   Tobacco Use   ??? Smoking status: Former Smoker     Packs/day: 1.50     Years: 25.00     Pack years: 37.50     Types: Cigarettes     Quit date: 09/10/2019     Years since quitting: 0.4   ??? Smokeless tobacco: Never Used   Vaping Use   ??? Vaping Use: Never used   Substance and Sexual Activity   ??? Alcohol use: No     Comment: very rarely- one time per year"use to drink beer every day but that was over 10 yrs" per pt on 12/31/2019   ??? Drug use: Not Currently     Types: Marijuana     Comment: "last used Dec 2020"   ??? Sexual activity: Never   Other Topics Concern   ??? None   Social History Narrative   ??? None     Social Determinants of Health     Financial Resource Strain:    ??? Difficulty of Paying Living Expenses:    Food Insecurity:    ??? Worried About Charity fundraiser in the Last Year:    ??? Arboriculturist in  the Last Year:    Transportation Needs:    ??? Film/video editor (Medical):    ??? Lack of Transportation (Non-Medical):    Physical Activity:    ??? Days of Exercise per Week:    ??? Minutes of Exercise per Session:    Stress:    ??? Feeling of Stress :    Social Connections:    ??? Frequency of Communication with Friends and Family:    ??? Frequency of Social Gatherings with Friends and Family:    ??? Attends Religious Services:    ??? Marine scientist or Organizations:    ??? Attends Music therapist:    ??? Marital Status:    Intimate Production manager Violence:    ??? Fear of Current or Ex-Partner:    ??? Emotionally Abused:    ??? Physically Abused:    ??? Sexually Abused:        Family History:   Family History   Problem Relation Age of Onset   ??? Other Mother        REVIEW OF SYSTEMS:    Review of Systems   Constitutional: Negative for chills and fever.   Respiratory: Negative for shortness of breath.    Cardiovascular: Negative for chest pain.   Gastrointestinal: Negative for abdominal pain, constipation, diarrhea, nausea and vomiting.   Skin: Positive for wound.       I have reviewed the patient's information pertinent to this visit, including medical history, family history, social history and review of systems.    PHYSICAL EXAM:    Vitals:    02/01/20 1334   BP: 136/86   Pulse: 97   Temp: 97.7 ??F (36.5 ??C)   SpO2: 96%   Height: 5\' 8"  (1.727 m)       Physical Exam  Constitutional:       General: He is not in acute distress.     Appearance: He is well-developed. He is not diaphoretic.   HENT:  Head: Normocephalic and atraumatic.   Eyes:      Pupils: Pupils are equal, round, and reactive to light.   Cardiovascular:      Rate and Rhythm: Normal rate and regular rhythm.   Pulmonary:      Effort: Pulmonary effort is normal. No respiratory distress.   Abdominal:      Palpations: Abdomen is soft.      Tenderness: There is no abdominal tenderness.   Genitourinary:     Comments: Fistulotomy site with clean base and trace  serous drainage. No erythema or purulence. Silver nitrate applied without apparent complication.   Neurological:      Mental Status: He is alert and oriented to person, place, and time.   Psychiatric:         Behavior: Behavior normal.         DATA:    Pathology Results:   Specimen [id number]       Final Pathologic Diagnosis:   Hemorrhoid, hemorrhoidectomy:   ???? ?? - ?? ?? Squamous and colonic epithelium with underlying   dilated vascular spaces, consistent with hemorrhoids.     Labs:  Lab Results   Component Value Date    WBC 9.0 12/31/2019    HGB 14.3 12/31/2019    HCT 42.6 12/31/2019    PLT 306 12/31/2019    NA 140 12/31/2019    K 4.2 12/31/2019    CL 101 12/31/2019    CO2 28 12/31/2019    BUN 17 12/31/2019    CREATININE 0.9 12/31/2019    GLUCOSE 78 12/31/2019    CALCIUM 9.9 12/31/2019    PROT 7.1 09/10/2019    BILITOT 0.3 09/10/2019    AST 19 09/10/2019    ALT 14 09/10/2019    ALKPHOS 121 09/10/2019       Imaging:   No results found.    Pertinent laboratory and imaging studies were personally reviewed if available.      IMPRESSION:    ILEY BREEDEN is a 44 y.o. male following-up postoperatively from HEMORRHOIDECTOMY PPH, RECTAL EXAM UNDER ANESTHESIA, EXTERNAL HEMORRHOIDECTOMY, AND ANAL FISTULOTOMY: [phone] (CPT??).    Visit Diagnoses:  1. Perianal fistula    2. Rectal bleeding    3. Grade II hemorrhoids    4. Postop check        Patient Active Problem List    Diagnosis Date Noted   ??? Grade II hemorrhoids 12/16/2019   ??? Rectal bleeding 12/16/2019   ??? Perianal fistula 12/16/2019   ??? MS (multiple sclerosis) (HCC)    ??? Hyperlipidemia    ??? Hypertension    ??? GERD (gastroesophageal reflux disease)    ??? Physical debility 09/10/2019       PLAN:  ??? Continue current care  ??? Increase activity as tolerated  ??? Pathology reviewed  ??? Silver nitrate applied to the fistulotomy site  ??? Follow Up: Return in about 2 weeks (around 02/15/2020) for for re-check.    No orders of the defined types were placed in this encounter.     No orders  of the defined types were placed in this encounter.      Electronically signed by Irineo Axon, MD, 02/06/2020, 10:06 PM.

## 2020-02-15 ENCOUNTER — Ambulatory Visit: Admit: 2020-02-15 | Payer: MEDICAID | Attending: Surgery | Primary: Internal Medicine

## 2020-02-15 DIAGNOSIS — K603 Anal fistula: Secondary | ICD-10-CM

## 2020-02-15 NOTE — Progress Notes (Signed)
GENERAL SURGERY OUTPATIENT POSTOPERATIVE NOTE  [facility]    PATIENT: Cory Nelson, Cory Nelson, August 03, 1976, 44 y.o., male    MRN: [id number]    Physician: Irineo Axon, MD    Date: 02/15/20    CHIEF COMPLAINT:     Chief Complaint   Patient presents with   ??? Follow-up     2 wk f/u sx 01/07/20 Hemorrhoidectomy PPH RECTAL EXAM WITH ANAL FISTULOTOMY       History Obtained From:  patient, electronic medical record    HISTORY OF PRESENT ILLNESS:      Cory Nelson is a 44 y.o. male presenting postoperatively for 2nd visit after HEMORRHOIDECTOMY PPH, RECTAL EXAM UNDER ANESTHESIA, EXTERNAL HEMORRHOIDECTOMY, AND ANAL FISTULOTOMY: [phone] (CPT??). Since last seen, he has been doing well overall. He has had some serous drainage from the fistulotomy site, but no purulent or feculent drainage, and the total amount of drainage has been decreasing. He has not had any further hemorrhoidal problems or bleeding.     Eating a regular diet without difficulty.   Bowel movement are Normal.    The patient is not having any pain.    Wounds/Incisions: are healing well. No erythema.     Past Medical History:    Past Medical History:   Diagnosis Date   ??? 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 GERD (gastroesophageal reflux disease)    ??? Hyperlipidemia    ??? Hypertension    ??? MS (multiple sclerosis) (HCC)     "dx with MS age 76, follow Dr Theotis Barrio  at [facility]""currently unable to walk at all"   ??? Spinal stenosis     hx per old chart- chronic low back pain       Past Surgical History:    Past Surgical History:   Procedure Laterality Date   ??? COLONOSCOPY  2020   ??? HEMORRHOID SURGERY N/A 01/07/2020    HEMORRHOIDECTOMY PPH, RECTAL EXAM UNDER ANESTHESIA, EXTERNAL HEMORRHOIDECTOMY, AND ANAL FISTULOTOMY performed by Irineo Axon, MD at [facility] OR   ??? UPPER GASTROINTESTINAL ENDOSCOPY         Current Medications:   Current Outpatient Medications   Medication Sig Dispense Refill   ??? pantoprazole  (PROTONIX) 40 MG tablet Take 40 mg by mouth daily     ??? methenamine (HIPREX) 1 g tablet Take 1 g by mouth daily     ??? oxybutynin (DITROPAN-XL) 10 MG extended release tablet Take 10 mg by mouth daily     ??? vitamin B-12 (CYANOCOBALAMIN) 1000 MCG tablet Take 1,000 mcg by mouth daily     ??? atorvastatin (LIPITOR) 10 MG tablet Take 10 mg by mouth nightly      ??? baclofen (LIORESAL) 10 MG tablet Take 20 mg by mouth 3 times daily      ??? vitamin D (ERGOCALCIFEROL) 50000 units CAPS capsule Take 50,000 Units by mouth once a week     ??? gabapentin (NEURONTIN) 300 MG capsule Take 600 mg by mouth 3 times daily.      ??? sertraline (ZOLOFT) 50 MG tablet Take 100 mg by mouth daily      ??? losartan-hydrochlorothiazide (HYZAAR) 50-12.5 MG per tablet Take 1 tablet by mouth daily       No current facility-administered medications for this visit.       Allergies:  Patient has no known allergies.    Social History:   Social History  Socioeconomic History   ??? Marital status: Single     Spouse name: None   ??? Number of children: None   ??? Years of education: None   ??? Highest education level: None   Occupational History   ??? None   Tobacco Use   ??? Smoking status: Former Smoker     Packs/day: 1.50     Years: 25.00     Pack years: 37.50     Types: Cigarettes     Quit date: 09/10/2019     Years since quitting: 0.4   ??? Smokeless tobacco: Never Used   Vaping Use   ??? Vaping Use: Never used   Substance and Sexual Activity   ??? Alcohol use: No     Comment: very rarely- one time per year"use to drink beer every day but that was over 10 yrs" per pt on 12/31/2019   ??? Drug use: Not Currently     Types: Marijuana     Comment: "last used Dec 2020"   ??? Sexual activity: Never   Other Topics Concern   ??? None   Social History Narrative   ??? None     Social Determinants of Health     Financial Resource Strain:    ??? Difficulty of Paying Living Expenses:    Food Insecurity:    ??? Worried About Charity fundraiser in the Last Year:    ??? Arboriculturist in the Last Year:     Transportation Needs:    ??? Film/video editor (Medical):    ??? Lack of Transportation (Non-Medical):    Physical Activity:    ??? Days of Exercise per Week:    ??? Minutes of Exercise per Session:    Stress:    ??? Feeling of Stress :    Social Connections:    ??? Frequency of Communication with Friends and Family:    ??? Frequency of Social Gatherings with Friends and Family:    ??? Attends Religious Services:    ??? Marine scientist or Organizations:    ??? Attends Music therapist:    ??? Marital Status:    Intimate Production manager Violence:    ??? Fear of Current or Ex-Partner:    ??? Emotionally Abused:    ??? Physically Abused:    ??? Sexually Abused:        Family History:   Family History   Problem Relation Age of Onset   ??? Other Mother        REVIEW OF SYSTEMS:    Review of Systems   Constitutional: Negative for chills and fever.   Respiratory: Negative for shortness of breath.    Cardiovascular: Negative for chest pain.   Gastrointestinal: Negative for abdominal pain, constipation, diarrhea, nausea and vomiting.   Skin: Positive for wound.       I have reviewed the patient's information pertinent to this visit, including medical history, family history, social history and review of systems.    PHYSICAL EXAM:    Vitals:    02/15/20 1337   BP: 120/78   Pulse: 76   Temp: 97.3 ??F (36.3 ??C)   SpO2: 96%   Weight: 192 lb (87.1 kg)   Height: 5\' 8"  (1.727 m)       Physical Exam  Constitutional:       General: He is not in acute distress.     Appearance: He is well-developed. He is not diaphoretic.   HENT:  Head: Normocephalic and atraumatic.   Eyes:      Pupils: Pupils are equal, round, and reactive to light.   Cardiovascular:      Rate and Rhythm: Normal rate and regular rhythm.   Pulmonary:      Effort: Pulmonary effort is normal. No respiratory distress.   Abdominal:      Palpations: Abdomen is soft.      Tenderness: There is no abdominal tenderness.   Genitourinary:     Comments: Fistulotomy site with clean base and  trace serous drainage. No erythema or purulence. Silver nitrate applied without apparent complication.   Neurological:      Mental Status: He is alert and oriented to person, place, and time.   Psychiatric:         Behavior: Behavior normal.         DATA:    Pathology Results:   Specimen [id number]       Final Pathologic Diagnosis:   Hemorrhoid, hemorrhoidectomy:   ???? ?? - ?? ?? Squamous and colonic epithelium with underlying   dilated vascular spaces, consistent with hemorrhoids.     Labs:  Lab Results   Component Value Date    WBC 9.0 12/31/2019    HGB 14.3 12/31/2019    HCT 42.6 12/31/2019    PLT 306 12/31/2019    NA 140 12/31/2019    K 4.2 12/31/2019    CL 101 12/31/2019    CO2 28 12/31/2019    BUN 17 12/31/2019    CREATININE 0.9 12/31/2019    GLUCOSE 78 12/31/2019    CALCIUM 9.9 12/31/2019    PROT 7.1 09/10/2019    BILITOT 0.3 09/10/2019    AST 19 09/10/2019    ALT 14 09/10/2019    ALKPHOS 121 09/10/2019       Imaging:   No results found.    Pertinent laboratory and imaging studies were personally reviewed if available.      IMPRESSION:    HAZIEL MOLNER is a 44 y.o. male following-up postoperatively from HEMORRHOIDECTOMY PPH, RECTAL EXAM UNDER ANESTHESIA, EXTERNAL HEMORRHOIDECTOMY, AND ANAL FISTULOTOMY: [phone] (CPT??).    Visit Diagnoses:  1. Perianal fistula    2. Rectal bleeding    3. Grade II hemorrhoids    4. Postop check        Patient Active Problem List    Diagnosis Date Noted   ??? Grade II hemorrhoids 12/16/2019   ??? Rectal bleeding 12/16/2019   ??? Perianal fistula 12/16/2019   ??? MS (multiple sclerosis) (HCC)    ??? Hyperlipidemia    ??? Hypertension    ??? GERD (gastroesophageal reflux disease)    ??? Physical debility 09/10/2019       PLAN:  ??? Continue current care  ??? Increase activity as tolerated  ??? Pathology reviewed  ??? Silver nitrate applied to the fistulotomy site  ??? Offered interval follow up in a few weeks vs follow up PRN. He will call if needed.   ??? Follow Up: Return if symptoms worsen or fail to  improve.    No orders of the defined types were placed in this encounter.     No orders of the defined types were placed in this encounter.      Electronically signed by Irineo Axon, MD, 02/16/2020, 10:57 PM.

## 2020-07-26 ENCOUNTER — Other Ambulatory Visit: Payer: Self-pay

## 2020-07-26 ENCOUNTER — Emergency Department (HOSPITAL_COMMUNITY): Payer: Medicaid Other

## 2020-07-26 ENCOUNTER — Emergency Department (HOSPITAL_COMMUNITY)
Admission: EM | Admit: 2020-07-26 | Discharge: 2020-07-27 | Disposition: A | Discharge status: Home / Self Care | Payer: Medicaid Other | Attending: Emergency Medicine | Admitting: Emergency Medicine

## 2020-07-26 DIAGNOSIS — R0602 Shortness of breath: Secondary | ICD-10-CM | POA: Diagnosis present

## 2020-07-26 DIAGNOSIS — J189 Pneumonia, unspecified organism: Secondary | ICD-10-CM | POA: Insufficient documentation

## 2020-07-26 DIAGNOSIS — Z20822 Contact with and (suspected) exposure to covid-19: Secondary | ICD-10-CM | POA: Insufficient documentation

## 2020-07-26 NOTE — ED Provider Notes (Signed)
[facility] EMERGENCY DEPARTMENT Provider Note   CSN: [id number] Arrival date & time: 07/26/20  2227     History Chief Complaint  Patient presents with  . Shortness of Breath    Ewel Lona is a 44 y.o. male.   44 year old male presents to the emergency department for evaluation of shortness of breath.  He states that symptoms began this afternoon after receiving his flu shot.  SOB has been constant, unchanged. He initially began to feel lethargic.  Developed an associated congested, nonproductive cough.  Denies fever, N/V, syncope, chest pain.  No known sick contacts in the home.  Lives with his mother and alludes to recently relocating to [place] from [place].       No past medical history on file.  There are no problems to display for this patient.   ** The histories are not reviewed yet. Please review them in the "History" navigator section and refresh this SmartLink.     No family history on file.  Social History   Tobacco Use  . Smoking status: Not on file  Substance Use Topics  . Alcohol use: Not on file  . Drug use: Not on file    Home Medications Prior to Admission medications   Medication Sig Start Date End Date Taking? Authorizing Provider  amoxicillin-clavulanate (AUGMENTIN) 875-125 MG tablet Take 1 tablet by mouth every 12 (twelve) hours. 07/27/20   Antony Madura, PA-C  azithromycin (ZITHROMAX) 100 MG/5ML suspension Take 12.5 mLs (250 mg total) by mouth daily for 4 days. 07/27/20 07/31/20  Antony Madura, PA-C    Allergies    Patient has no known allergies.  Review of Systems   Review of Systems  Ten systems reviewed and are negative for acute change, except as noted in the HPI.    Physical Exam Updated Vital Signs BP 119/75   Pulse (!) 101   Temp 98.7 F (37.1 C) (Oral)   Resp 17   Ht 5\' 10"  (1.778 m)   Wt 86.2 kg   SpO2 94%   BMI 27.26 kg/m   Physical Exam Vitals and nursing note reviewed.  Constitutional:       General: He is not in acute distress.    Appearance: He is well-developed. He is not diaphoretic.     Comments: Nontoxic appearing  HENT:     Head: Normocephalic and atraumatic.  Eyes:     General: No scleral icterus.    Conjunctiva/sclera: Conjunctivae normal.  Cardiovascular:     Rate and Rhythm: Normal rate and regular rhythm.     Pulses: Normal pulses.  Pulmonary:     Effort: Pulmonary effort is normal. No respiratory distress.     Breath sounds: No stridor. No wheezing or rales.     Comments: Respirations even and unlabored. Sats 92-94% on room air. Musculoskeletal:        General: Normal range of motion.     Cervical back: Normal range of motion.  Skin:    General: Skin is warm and dry.     Coloration: Skin is not pale.     Findings: No erythema or rash.  Neurological:     Mental Status: He is alert and oriented to person, place, and time.     GCS: GCS eye subscore is 4. GCS verbal subscore is 5. GCS motor subscore is 6.  Psychiatric:        Behavior: Behavior is slowed.     ED Results / Procedures / Treatments   Labs (  all labs ordered are listed, but only abnormal results are displayed) Labs Reviewed  CBC WITH DIFFERENTIAL/PLATELET - Abnormal; Notable for the following components:      Result Value   WBC 17.0 (*)    Hemoglobin 12.5 (*)    HCT 37.7 (*)    Platelets 423 (*)    Neutro Abs 10.9 (*)    Monocytes Absolute 2.5 (*)    Eosinophils Absolute 0.8 (*)    Abs Immature Granulocytes 0.26 (*)    All other components within normal limits  BASIC METABOLIC PANEL - Abnormal; Notable for the following components:   Sodium 133 (*)    Potassium 3.1 (*)    Chloride 97 (*)    Glucose, Bld 119 (*)    All other components within normal limits  RESP PANEL BY RT PCR (RSV, FLU A&B, COVID)  LACTIC ACID, PLASMA    EKG EKG Interpretation  Date/Time:  Wednesday July 26 2020 23:34:00 EST Ventricular Rate:  96 PR Interval:    QRS Duration: 93 QT  Interval:  346 QTC Calculation: 438 R Axis:   79 Text Interpretation: Sinus rhythm Consider right atrial enlargement Right ventricular hypertrophy No old tracing to compare Confirmed by Ward, Baxter Hire [phone]) on 07/26/2020 11:59:34 PM   Radiology DG Chest 2 View  Result Date: 07/26/2020 CLINICAL DATA:  Dyspnea EXAM: CHEST - 2 VIEW COMPARISON:  None. FINDINGS: Focal pulmonary infiltrate is seen within the a right lung base, possibly infectious in the acute setting. There is a superimposed posteriorly and laterally loculated moderate right pleural effusion possibly representing a parapneumonic effusion. Left lung is clear. No pneumothorax. No pleural effusion on the left. Cardiac size within normal limits. No acute bone abnormality. IMPRESSION: Left lower lobe pneumonia. Associated moderate loculated probable parapneumonic effusion. Electronically Signed   By: Helyn Numbers MD   On: 07/26/2020 23:30    Procedures Procedures (including critical care time)  Medications Ordered in ED Medications  sodium chloride 0.9 % bolus 1,000 mL (0 mLs Intravenous Stopped 07/27/20 0229)  cefTRIAXone (ROCEPHIN) 1 g in sodium chloride 0.9 % 100 mL IVPB (0 g Intravenous Stopped 07/27/20 0229)  azithromycin (ZITHROMAX) tablet 500 mg (500 mg Oral Given 07/27/20 0139)    ED Course  I have reviewed the triage vital signs and the nursing notes.  Pertinent labs & imaging results that were available during my care of the patient were reviewed by me and considered in my medical decision making (see chart for details).  Clinical Course as of Jul 27 549  Thu Jul 27, 2020  0024 Mother at bedside and additional history obtained.  Patient has a history of neurogenic bladder, multiple sclerosis, is wheelchair-bound. Came to live with Mother on 07/14/20 after his father in [place] was no longer able to care for him.   Mother reports flu shot was Thursday (07/20/20) and patient ran a fever Tmax 101F following administration  of the flu shot. Was receiving Tylenol for this. Today was eating when mother heard a "different" cough from the patient and felt he may have aspirated. States he has dysphagia at baseline due to his MS.  CXR does look c/w PNA. COVID test and labs are pending. Sats remain ~95% on room air.    [KH]  0254 Patient sleeping, in NAD. No hypoxia; SpO2 92-93%. Lactate reassuring and VSS. Plan for discharge on course of outpatient abx.   [KH]    Clinical Course User Index [KH] Antony Madura, PA-C   MDM Rules/Calculators/A&P  44 year old male with history of severe MS and mobility issues presents to the emergency department for evaluation of shortness of breath.  Relocated to [place] from [place] approximately 2 weeks ago to live with his mother.  Shortness of breath and cough have been more pronounced today.  Mother expresses concern that the patient may have aspirated as he has baseline issues with dysphagia.  He did, however, begin to become more lethargic with fever up to 101F one week ago following administration of the flu shot.  Patient with no respiratory distress, hypoxia.  Lung sounds grossly clear.  Oxygen saturations averaging between 93 to 95% on room air throughout ED encounter.  Cough, shortness of breath consistent with diagnosis of left lower lobe pneumonia visualized on chest x-ray.  This may have also contributed to preceding fever.  Patient does have a leukocytosis of 17 consistent with acute infection.  His lactate is reassuring at 1.2.  Prior records accessed on Care Everywhere.  Given no hospitalizations since March, will treat for community-acquired pneumonia.  Given Rocephin and azithromycin in the ED.  Discharged on Augmentin and azithromycin.  Advised close follow-up with the patient's primary care doctor.  Encouraged to return for symptomatic worsening.  Patient discharged via [facility] in stable condition.   Final Clinical Impression(s) / ED  Diagnoses Final diagnoses:  Community acquired pneumonia of left lower lobe of lung    Rx / DC Orders ED Discharge Orders         Ordered    azithromycin (ZITHROMAX) 100 MG/5ML suspension  Daily        07/27/20 0307    amoxicillin-clavulanate (AUGMENTIN) 875-125 MG tablet  Every 12 hours        07/27/20 0307           Antony Madura, PA-C 07/27/20 0550    Ward, Layla Maw, DO 07/27/20 0732

## 2020-07-26 NOTE — ED Triage Notes (Signed)
Patient arrived with EMS stating patient was having difficulty breathing, he recently got his flu shot and has been running a low grade fever per EMS. Patient has no complaints at this time is 93% on RA.

## 2020-07-27 LAB — CBC WITH DIFFERENTIAL/PLATELET
Abs Immature Granulocytes: 0.26 10*3/uL — ABNORMAL HIGH (ref 0.00–0.07)
Basophils Absolute: 0.1 10*3/uL (ref 0.0–0.1)
Basophils Relative: 1 %
Eosinophils Absolute: 0.8 10*3/uL — ABNORMAL HIGH (ref 0.0–0.5)
Eosinophils Relative: 5 %
HCT: 37.7 % — ABNORMAL LOW (ref 39.0–52.0)
Hemoglobin: 12.5 g/dL — ABNORMAL LOW (ref 13.0–17.0)
Immature Granulocytes: 2 %
Lymphocytes Relative: 14 %
Lymphs Abs: 2.4 10*3/uL (ref 0.7–4.0)
MCH: 28.2 pg (ref 26.0–34.0)
MCHC: 33.2 g/dL (ref 30.0–36.0)
MCV: 85.1 fL (ref 80.0–100.0)
Monocytes Absolute: 2.5 10*3/uL — ABNORMAL HIGH (ref 0.1–1.0)
Monocytes Relative: 15 %
Neutro Abs: 10.9 10*3/uL — ABNORMAL HIGH (ref 1.7–7.7)
Neutrophils Relative %: 63 %
Platelets: 423 10*3/uL — ABNORMAL HIGH (ref 150–400)
RBC: 4.43 MIL/uL (ref 4.22–5.81)
RDW: 12.5 % (ref 11.5–15.5)
WBC: 17 10*3/uL — ABNORMAL HIGH (ref 4.0–10.5)
nRBC: 0 % (ref 0.0–0.2)

## 2020-07-27 LAB — BASIC METABOLIC PANEL
Anion gap: 13 (ref 5–15)
BUN: 16 mg/dL (ref 6–20)
CO2: 23 mmol/L (ref 22–32)
Calcium: 8.9 mg/dL (ref 8.9–10.3)
Chloride: 97 mmol/L — ABNORMAL LOW (ref 98–111)
Creatinine, Ser: 0.96 mg/dL (ref 0.61–1.24)
GFR, Estimated: >60 mL/min (ref >60)
Glucose, Bld: 119 mg/dL — ABNORMAL HIGH (ref 70–99)
Potassium: 3.1 mmol/L — ABNORMAL LOW (ref 3.5–5.1)
Sodium: 133 mmol/L — ABNORMAL LOW (ref 135–145)

## 2020-07-27 LAB — RESP PANEL BY RT PCR (RSV, FLU A&B, COVID)
Influenza A by PCR: NEGATIVE
Influenza B by PCR: NEGATIVE
Respiratory Syncytial Virus by PCR: NEGATIVE
SARS Coronavirus 2 by RT PCR: NEGATIVE

## 2020-07-27 LAB — LACTIC ACID, PLASMA: Lactic Acid, Venous: 1.2 mmol/L (ref 0.5–1.9)

## 2020-07-27 MED ORDER — SODIUM CHLORIDE 0.9 % IV BOLUS
1000.0000 mL | Freq: Once | INTRAVENOUS | Status: AC
  Administered 2020-07-27: 1000 mL via INTRAVENOUS

## 2020-07-27 MED ORDER — AZITHROMYCIN 250 MG PO TABS
500.0000 mg | ORAL_TABLET | Freq: Once | ORAL | Status: AC
  Administered 2020-07-27: 500 mg via ORAL
  Filled 2020-07-27: qty 2

## 2020-07-27 MED ORDER — SODIUM CHLORIDE 0.9 % IV SOLN
1.0000 g | Freq: Once | INTRAVENOUS | Status: AC
  Administered 2020-07-27: 1 g via INTRAVENOUS
  Filled 2020-07-27: qty 10

## 2020-07-27 MED ORDER — AMOXICILLIN-POT CLAVULANATE 875-125 MG PO TABS: 1.0000 | ORAL_TABLET | Freq: Two times a day (BID) | ORAL | 0 refills | Status: DC

## 2020-07-27 MED ORDER — AZITHROMYCIN 100 MG/5ML PO SUSR: 250.0000 mg | Freq: Every day | ORAL | 0 refills | Status: AC

## 2020-07-27 NOTE — ED Notes (Signed)
[facility] at bedside to transport patient home.   

## 2020-07-27 NOTE — Discharge Instructions (Addendum)
Take the antibiotics prescribed to you until finished. Follow up with your doctor on Friday for recheck. Return to the ED for worsening symptoms such as increased shortness of breath, nausea/vomiting, or altered mental status.

## 2020-08-14 ENCOUNTER — Encounter: Payer: Self-pay | Admitting: Neurology

## 2020-08-14 ENCOUNTER — Ambulatory Visit: Payer: Medicaid Other | Admitting: Neurology

## 2020-08-14 ENCOUNTER — Other Ambulatory Visit: Payer: Self-pay

## 2020-08-14 VITALS — BP 99/70 | HR 102 | Ht 70.0 in

## 2020-08-14 DIAGNOSIS — R531 Weakness: Secondary | ICD-10-CM

## 2020-08-14 DIAGNOSIS — R269 Unspecified abnormalities of gait and mobility: Secondary | ICD-10-CM

## 2020-08-14 DIAGNOSIS — G35 Multiple sclerosis: Secondary | ICD-10-CM | POA: Diagnosis not present

## 2020-08-14 DIAGNOSIS — R32 Unspecified urinary incontinence: Secondary | ICD-10-CM | POA: Diagnosis not present

## 2020-08-14 DIAGNOSIS — Z298 Encounter for other specified prophylactic measures: Secondary | ICD-10-CM

## 2020-08-14 MED ORDER — BACLOFEN 10 MG PO TABS
10.0000 mg | ORAL_TABLET | Freq: Three times a day (TID) | ORAL | 5 refills | Status: DC
Start: 2020-08-14 — End: 2020-08-17

## 2020-08-14 NOTE — Progress Notes (Addendum)
[facility]  PATIENT: Dennis Dennis DOB: [birth year]  REFERRING DOCTOR OR PCP: Imogene Burn, NP; Crist Fat, MD SOURCE: Patient, notes from [facility] MS center  _________________________________   HISTORICAL  CHIEF COMPLAINT:  Chief Complaint  Patient presents with  . New Patient (Initial Visit)    RM 1 with mother, Dois Davenport. Paper referral from Imogene Burn for transfer of care for MS.  . Gait Problem    In WC. Has not walked in 2 yrs per mother.   . Multiple Sclerosis    Dx about 30 years ago. (Age 81). Has tried all meds there is to offer per mother.  Most recent DMT: Ocrevus (about 6 months ago).     HISTORY OF PRESENT ILLNESS:  I had the pleasure of seeing patient, Dennis Dennis, at the [facility] at [facility] for neurologic consultation regarding his progressive MS.  He is a 44 year old man who was diagnosed with MS diagnosed at age 76 in 57 after presenting with diplopia that developed overnight.  His mother noted the eyes were not moving correctly and he went to the emergency room.  A CT in the ED showed abnormality and a follow up MRI was consistent with MS. CSF reportedly showed abnormalities consistent with MS.  He received IV Solumedrol and was started on Betaseron.   He had breakthrough exacerbations including severe ones with right sided weakness and partial blindness.   He was on Copaxone for a while but had breakthrough exacerbations.   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    He switched to Ocrevus most recently from 2018 to May 2020.  He continued to progress and had his last treatment May 2020.    Mayzent was discussed but they opted not to proceed.   Earlier this year, he moved from [place] (was living with his father) to [place] (to live with mother)  He was stable until early November when he had an influenza vaccination.  Later that day, he could not move and became less responsive over  the next couple of days.Marland Kitchen   He was taken to [facility] and diagnosed with pneumonia.   Although his mental status has returned to normal, he continues to be weaker than he was before the pneumonia.     He was able to transfer with assistance but now a Michiel Sites lift is needd   He last walked with a walker 2 years ago.  Currently, he is wheelchair and bed bound.   He no longer assists with transfers.   He is very weak on the right and weak on the left, legs worse in arms.   The left arm is just minimally weak   he is no longer continent but was partially so until the recent pneumonia.    He has severe spasticity in his legs and has some scissoring making catheterization difficult.  For spasticity, he is on baclofen 10 mg po bid.   Due to dysphagia, he is on a soft and pured diet.  He has severe cognitive dysfunction.  His mother notes that he has difficulty completing sentences at times and has some verbal fluency issues.  He sleeps 14 hours a day.     He was smoking MJ daily x years but has not smoked any since moving in with his mother.  He has had his Covid 19 vaccination but was on Ocrevus at the time  Due to doing poorly after the  influenza vaccina a few weeks ago, his mother prefers not to have him get the booster.     Images reviewed: MRI of the brain 12/03/2017 and 09/01/2019 showed moderate generalized cortical atrophy.  There are multiple T2/FLAIR hyperintense foci in the periventricular, juxtacortical deep white matter of the hemispheres and in the midbrain, pons, middle cerebellar peduncles and medulla.  There were no enhancing lesions.  MRI of the cervical spine 12/03/2017 and 09/01/2019 showed movement artifact.  In this context, there are patchy T2 hyperintense foci.  Most prominent foci are at C1, C2 and C6 and T1 and T2 there is also moderately severe spinal stenosis at C6-C7 due to a left paramedian disc protrusion and mild to moderate spinal stenosis at C3-C4 and C4-C5 and C5C6.   There is no abnormal enhancement.  REVIEW OF SYSTEMS: Constitutional: No fevers, chills, sweats, or change in appetite. Eyes: No visual changes,eye pain.  He has diplopia Ear, nose and throat: No hearing loss, ear pain, nasal congestion, sore throat Cardiovascular: No chest pain, palpitations Respiratory: No shortness of breath at rest or with exertion.   No wheezes GastrointestinaI: No nausea, vomiting, diarrhea, abdominal pain, fecal incontinence Genitourinary:Incontinence as above Musculoskeletal:He has lower back pain. Integumentary: No rash, pruritus, skin lesions Neurological: as above Psychiatric: No depression at this time.  No anxiety Endocrine: No palpitations, diaphoresis, change in appetite, change in weigh or increased thirst Hematologic/Lymphatic: No anemia, purpura, petechiae. Allergic/Immunologic: No itchy/runny eyes, nasal congestion, recent allergic reactions, rashes  ALLERGIES: No Known Allergies  HOME MEDICATIONS:  Current Outpatient Medications:  .  atorvastatin (LIPITOR) 10 MG tablet, Take 10 mg by mouth daily., Disp: , Rfl:  .  baclofen (LIORESAL) 10 MG tablet, Take 10 mg by mouth 3 (three) times daily., Disp: , Rfl:  .  Cyanocobalamin (VITAMIN B-12 PO), Take 1 Dose by mouth daily., Disp: , Rfl:  .  docusate sodium (COLACE) 100 MG capsule, Take 100 mg by mouth 2 (two) times daily., Disp: , Rfl:  .  ergocalciferol (VITAMIN D2) 1.25 MG (50000 UT) capsule, Take 50,000 Units by mouth once a week., Disp: , Rfl:  .  gabapentin (NEURONTIN) 300 MG capsule, Take 600 mg by mouth 3 (three) times daily., Disp: , Rfl:  .  losartan-hydrochlorothiazide (HYZAAR) 50-12.5 MG tablet, Take 1 tablet by mouth daily., Disp: , Rfl:  .  methenamine (MANDELAMINE) 1 g tablet, Take 1,000 mg by mouth 2 (two) times daily., Disp: , Rfl:   PAST MEDICAL HISTORY: No past medical history on file.  PAST SURGICAL HISTORY: None  FAMILY HISTORY: No family history on file.  SOCIAL  HISTORY:  Social History   Socioeconomic History  . Marital status: Single    Spouse name: Not on file  . Number of children: Not on file  . Years of education: Not on file  . Highest education level: Not on file  Occupational History  . Not on file  Tobacco Use  . Smoking status: Not on file  Substance and Sexual Activity  . Alcohol use: Not on file  . Drug use: Not on file  . Sexual activity: Not on file  Other Topics Concern  . Not on file  Social History Narrative   Lives   Caffeine use:    Social Determinants of Health   Financial Resource Strain:   . Difficulty of Paying Living Expenses: Not on file  Food Insecurity:   . Worried About Programme researcher, broadcasting/film/video in the Last Year: Not on file  .  Ran Out of Food in the Last Year: Not on file  Transportation Needs:   . Lack of Transportation (Medical): Not on file  . Lack of Transportation (Non-Medical): Not on file  Physical Activity:   . Days of Exercise per Week: Not on file  . Minutes of Exercise per Session: Not on file  Stress:   . Feeling of Stress : Not on file  Social Connections:   . Frequency of Communication with Friends and Family: Not on file  . Frequency of Social Gatherings with Friends and Family: Not on file  . Attends Religious Services: Not on file  . Active Member of Clubs or Organizations: Not on file  . Attends Banker Meetings: Not on file  . Marital Status: Not on file  Intimate Partner Violence:   . Fear of Current or Ex-Partner: Not on file  . Emotionally Abused: Not on file  . Physically Abused: Not on file  . Sexually Abused: Not on file     PHYSICAL EXAM  Vitals:   08/14/20 0809  BP: 99/70  Pulse: (!) 102  SpO2: 98%  Height: 5\' 10"  (1.778 m)    Body mass index is 27.26 kg/m.   General: The patient is well-developed and well-nourished and in no acute distress.  He is wheelchair-bound  HEENT:  Head is [place]/AT.  Sclera are anicteric.  Funduscopic exam shows normal  optic discs and retinal vessels.  Neck: No carotid bruits are noted.  The neck is nontender.  Cardiovascular: The heart has a regular rate and rhythm with a normal S1 and S2. There were no murmurs, gallops or rubs.    Skin: Extremities are without rash or  edema.  Musculoskeletal:  Back is nontender  Neurologic Exam  Mental status: The patient is alert and oriented x 3 at the time of the examination. The patient has apparent normal recent and remote memory, with an apparently normal attention span and concentration ability.   Speech is normal.  Cranial nerves: He has bilateral INO's.. Pupils are equal, round, and reactive to light and accomodation. He has reduced vision bilaterally, able to count fingers but not read, left worse than rightl.  Facial symmetry is present. There is good facial sensation to soft touch bilaterally.Facial strength is normal.  Trapezius and sternocleidomastoid strength is normal. No dysarthria is noted.  The tongue is midline, and the patient has symmetric elevation of the soft palate. No obvious hearing deficits are noted.  Motor:  Muscle bulk is normal.   Muscle tone is increased, right greater than left, legs greater than arms.  Strength is 4+ to 5/5 in the left arm, 4+/5 in the right arm except for 4/5 finger extensors.  Strength is 4 -/5 in the left leg and 2+-3/5 in the right leg x 4-/5 quads  Sensory: Sensory testing is intact to pinprick, soft touch and vibration sensation in arms and mildly reduced vibration sensation in the legs  Coordination: Reduced finger-nose-finger on the right.  Heel-to-shin is poor on the left that he cannot do on the right.  Gait and station: He is unable to stand  Reflexes: Deep tendon reflexes are increased in legs, right greater than left.  He has crossed abductors at the right knee and nonsustained clonus at the right ankle   plantar responses are extensor.    DIAGNOSTIC DATA (LABS, IMAGING, TESTING) - I reviewed patient  records, labs, notes, testing and imaging myself where available.  Lab Results  Component Value Date  WBC 17.0 (H) 07/26/2020   HGB 12.5 (L) 07/26/2020   HCT 37.7 (L) 07/26/2020   MCV 85.1 07/26/2020   PLT 423 (H) 07/26/2020      Component Value Date/Time   NA 133 (L) 07/26/2020 2359   K 3.1 (L) 07/26/2020 2359   CL 97 (L) 07/26/2020 2359   CO2 23 07/26/2020 2359   GLUCOSE 119 (H) 07/26/2020 2359   BUN 16 07/26/2020 2359   CREATININE 0.96 07/26/2020 2359   CALCIUM 8.9 07/26/2020 2359   GFRNONAA >60 07/26/2020 2359       ASSESSMENT AND PLAN  Multiple sclerosis (HCC) - Plan: Ambulatory referral to Physical Therapy, SAR CoV2 Serology (COVID 19)AB(IGG)IA  Gait disturbance  Weakness  Urinary incontinence, unspecified type  Encounter for immunotherapy - Plan: SAR CoV2 Serology (COVID 19)AB(IGG)IA   In summary, Mr. Levene is a 44 year old man with a 27-year history of multiple sclerosis who is currently not on any disease modifying therapy.  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  Of note, he has not had any exacerbations since his last Ocrevus infusion May 2020.  We discussed that the existing disease modifying therapies are much better at preventing relapses then at preventing or slowing progression.  Mayzent would be an option if he has another exacerbation.  To help with the spasticity , I will increase the baclofen to 10 mg in the morning, 10 mg in the afternoon and 20 mg at night (currently on 10 mg twice a day) I will also set up physical therapy.  He has had his COVID-19 vaccination but not the booster.  Due to having pneumonia after his influenza vaccination, his mother is reluctant to have him get the booster.  I will see if he has IgG against COVID-19 spike protein.  If he does not, I would recommend that he get the booster.  He will return to see me in 6 months or sooner if there are new or worsening neurologic  symptoms.   Marko Skalski A. Epimenio Foot, MD, [facility] 08/14/2020, 9:08 AM Certified in Neurology, Clinical Neurophysiology, Sleep Medicine and Neuroimaging  [facility] [address], [address] [place], [place] [address] [phone]

## 2020-08-15 ENCOUNTER — Telehealth: Payer: Self-pay | Admitting: *Deleted

## 2020-08-15 LAB — SAR COV2 SEROLOGY (COVID19)AB(IGG),IA
SARS-CoV-2 Semi-Quant IgG Ab: 103 AU/mL (ref <13.0)
SARS-CoV-2 Spike Ab Interp: POSITIVE

## 2020-08-15 NOTE — Telephone Encounter (Signed)
-----   Message from Asa Lente, MD sent at 08/15/2020  9:37 AM EST ----- The antibody test was positive which means that the vaccination he had in the past worked.  Since he had a bad reaction to a recent vaccination, it should be okay if he skips the COVID-19 booster, at least for now.

## 2020-08-15 NOTE — Telephone Encounter (Signed)
Called and spoke with pt mother about lab results (on DPR) per Dr. Epimenio Foot note. She verbalized understanding.

## 2020-08-17 ENCOUNTER — Other Ambulatory Visit: Payer: Self-pay | Admitting: *Deleted

## 2020-08-17 MED ORDER — BACLOFEN 10 MG PO TABS: ORAL_TABLET | ORAL | 5 refills | Status: DC

## 2020-09-13 ENCOUNTER — Telehealth: Payer: Self-pay | Admitting: Neurology

## 2020-09-13 NOTE — Telephone Encounter (Signed)
I returned the call to the patient's mother. Reports his vision decline has been present for years (noted in chart). She recently took him to [facility] in hopes they could help. The optometrist did not feel his problems could be corrected by glasses. They mentioned there may be some benefit from him seeing a neuro-ophthalmologist. She wanted to know Dr. Bonnita Hollow opinion on this suggestion. If he is in agreement, she is asking for a referral.

## 2020-09-13 NOTE — Telephone Encounter (Signed)
Pt's mother Dois Davenport, on [place] called wanting to discuss the pt's vision with provider. She states that his vision is very poor and he was taken for an eye exam and according to the physician there is nothing wrong and she is thinking this is neurological. Dois Davenport would like to be advised.

## 2020-09-17 NOTE — Telephone Encounter (Signed)
Please let the mother know that many people with MS develop permanent visual impairment over the years due to MS involving the nerves going from the eyes to the brain.  It is unlikely that there would be any procedure to improve the vision.  If he would like a second opinion he could be referred to a neuro-ophthalmologist.

## 2020-09-18 NOTE — Telephone Encounter (Signed)
Called and spoke with pt mother. Relayed Dr. Bonnita Hollow message. She verbalized understanding. She trusts Dr. Bonnita Hollow opinion. They do not want referral to neuro-ophthalmologist at this time. They will call in the future if he develops any new or worsening sx.

## 2020-10-11 ENCOUNTER — Telehealth: Payer: Self-pay | Admitting: Neurology

## 2020-10-11 ENCOUNTER — Other Ambulatory Visit: Payer: Self-pay | Admitting: Neurology

## 2020-10-11 MED ORDER — OLANZAPINE 5 MG PO TABS: 5.0000 mg | ORAL_TABLET | Freq: Every day | ORAL | 5 refills | Status: DC

## 2020-10-11 NOTE — Telephone Encounter (Signed)
Called and spoke with Dois Davenport. Relayed Dr. Bonnita Hollow recommendation. She is agreeable to plan. She will call back if she does not notice improvement on medication.

## 2020-10-11 NOTE — Telephone Encounter (Signed)
Submitted PA on CMM. Key: [id number]. Received instant approval: "PA Case: [id number], Status: Approved, Coverage Starts on: 10/11/2020 12:00:00 AM, Coverage Ends on: 10/11/2021 12:00:00 AM."

## 2020-10-11 NOTE — Telephone Encounter (Signed)
Pt's mother, Vincente Liberty (on [place]) called,after 12:30p my son is out of control; crawling out of the bed, screaming for help. I am 45 year old and it's hard to get him back in the bed.  Can the physician prescribe something to calm him down. Would like a call from the nurse.

## 2020-10-11 NOTE — Telephone Encounter (Signed)
I will send in a prescription for a calming medication (olanzapine)

## 2020-10-11 NOTE — Telephone Encounter (Signed)
Called [place] back. She reports she is unable to control son. For instance, when she got off phone with our phone staff, he had unscrewed all things on bed to get out of bed. He will yell things like "plesae help, send me to nursing home, you are holding me against my will"  Dennis Mann says it starts about 12:30p-6:30pm and occurs daily. This all started shortly after seeing Dr. Epimenio Foot last. Feels cognition/memory is worse. Has severely worsened in last 3 weeks. Denies any infection. She caths him. He is WC bound. Makes him drink lots of water. He has been with mother for 3 months. Dad wanted to put him in nursing home in [facility] but she did not want that so she took him in. She started using baby monitor. One day, he smeared poop everywhere from brief he was wearing. Next day, claimed he did not remember this event. I confirmed meds current on list in epic. Advised I will send phone call to MD to review and provide recommendation. We will call back.

## 2020-11-02 ENCOUNTER — Telehealth: Payer: Self-pay | Admitting: Neurology

## 2020-11-02 ENCOUNTER — Other Ambulatory Visit: Payer: Self-pay | Admitting: Neurology

## 2020-11-02 NOTE — Telephone Encounter (Signed)
Called pharmacy. Their system automatically requested refill for 90 days supply. We denied d/t it previously being approved via isn[employer]ae for 30 days supply and they should have refills on file. They already spoke with mother and resolved issue, set to refill in the morning. Nothing further needed.

## 2020-11-02 NOTE — Telephone Encounter (Signed)
Pt's mother called wanting to speak to the RN to discuss the pt's OLANZapine (ZYPREXA) 5 MG tablet She states the pharmacy called her and informed her that the refills on the medication were "inapropriate" Mother states that they really gave her a tough time about this medication and she is wanting to know what can be done because the pt is about to run out of the ones he has left. Please advise.

## 2020-11-21 ENCOUNTER — Emergency Department (HOSPITAL_COMMUNITY): Payer: Medicaid Other

## 2020-11-21 ENCOUNTER — Encounter (HOSPITAL_COMMUNITY): Payer: Self-pay | Admitting: Radiology

## 2020-11-21 ENCOUNTER — Inpatient Hospital Stay (HOSPITAL_COMMUNITY)
Admission: EM | Admit: 2020-11-21 | Discharge: 2020-11-24 | DRG: 872 | Disposition: A | Discharge status: Hospice | Payer: Medicaid Other | Attending: Internal Medicine | Admitting: Internal Medicine

## 2020-11-21 ENCOUNTER — Other Ambulatory Visit: Payer: Self-pay

## 2020-11-21 DIAGNOSIS — Z87440 Personal history of urinary (tract) infections: Secondary | ICD-10-CM | POA: Diagnosis not present

## 2020-11-21 DIAGNOSIS — Z79899 Other long term (current) drug therapy: Secondary | ICD-10-CM

## 2020-11-21 DIAGNOSIS — N35919 Unspecified urethral stricture, male, unspecified site: Secondary | ICD-10-CM | POA: Diagnosis present

## 2020-11-21 DIAGNOSIS — R4189 Other symptoms and signs involving cognitive functions and awareness: Secondary | ICD-10-CM | POA: Diagnosis not present

## 2020-11-21 DIAGNOSIS — E876 Hypokalemia: Secondary | ICD-10-CM | POA: Diagnosis present

## 2020-11-21 DIAGNOSIS — R339 Retention of urine, unspecified: Secondary | ICD-10-CM | POA: Diagnosis not present

## 2020-11-21 DIAGNOSIS — I10 Essential (primary) hypertension: Secondary | ICD-10-CM | POA: Diagnosis not present

## 2020-11-21 DIAGNOSIS — Z8249 Family history of ischemic heart disease and other diseases of the circulatory system: Secondary | ICD-10-CM

## 2020-11-21 DIAGNOSIS — R319 Hematuria, unspecified: Secondary | ICD-10-CM | POA: Diagnosis present

## 2020-11-21 DIAGNOSIS — Z20822 Contact with and (suspected) exposure to covid-19: Secondary | ICD-10-CM | POA: Diagnosis not present

## 2020-11-21 DIAGNOSIS — G35 Multiple sclerosis: Secondary | ICD-10-CM | POA: Diagnosis not present

## 2020-11-21 DIAGNOSIS — N319 Neuromuscular dysfunction of bladder, unspecified: Secondary | ICD-10-CM | POA: Diagnosis not present

## 2020-11-21 DIAGNOSIS — Z993 Dependence on wheelchair: Secondary | ICD-10-CM | POA: Diagnosis not present

## 2020-11-21 DIAGNOSIS — G35D Multiple sclerosis, unspecified: Secondary | ICD-10-CM | POA: Diagnosis present

## 2020-11-21 DIAGNOSIS — Z7401 Bed confinement status: Secondary | ICD-10-CM | POA: Diagnosis not present

## 2020-11-21 DIAGNOSIS — N390 Urinary tract infection, site not specified: Secondary | ICD-10-CM | POA: Diagnosis not present

## 2020-11-21 DIAGNOSIS — E785 Hyperlipidemia, unspecified: Secondary | ICD-10-CM | POA: Diagnosis not present

## 2020-11-21 DIAGNOSIS — A419 Sepsis, unspecified organism: Secondary | ICD-10-CM | POA: Diagnosis not present

## 2020-11-21 DIAGNOSIS — R1319 Other dysphagia: Secondary | ICD-10-CM | POA: Diagnosis present

## 2020-11-21 DIAGNOSIS — S3730XA Unspecified injury of urethra, initial encounter: Secondary | ICD-10-CM

## 2020-11-21 HISTORY — DX: Other symptoms and signs involving cognitive functions and awareness: R41.89

## 2020-11-21 HISTORY — DX: Anal fissure, unspecified: K60.2

## 2020-11-21 HISTORY — DX: Hyperlipidemia, unspecified: E78.5

## 2020-11-21 HISTORY — DX: Multiple sclerosis: G35

## 2020-11-21 HISTORY — DX: Essential (primary) hypertension: I10

## 2020-11-21 LAB — CBC WITH DIFFERENTIAL/PLATELET
Abs Immature Granulocytes: 0.25 10*3/uL — ABNORMAL HIGH (ref 0.00–0.07)
Basophils Absolute: 0.1 10*3/uL (ref 0.0–0.1)
Basophils Relative: 0 %
Eosinophils Absolute: 0.1 10*3/uL (ref 0.0–0.5)
Eosinophils Relative: 0 %
HCT: 40.3 % (ref 39.0–52.0)
Hemoglobin: 13.5 g/dL (ref 13.0–17.0)
Immature Granulocytes: 1 %
Lymphocytes Relative: 2 %
Lymphs Abs: 0.7 10*3/uL (ref 0.7–4.0)
MCH: 28 pg (ref 26.0–34.0)
MCHC: 33.5 g/dL (ref 30.0–36.0)
MCV: 83.4 fL (ref 80.0–100.0)
Monocytes Absolute: 2.1 10*3/uL — ABNORMAL HIGH (ref 0.1–1.0)
Monocytes Relative: 7 %
Neutro Abs: 27.5 10*3/uL — ABNORMAL HIGH (ref 1.7–7.7)
Neutrophils Relative %: 90 %
Platelets: 218 10*3/uL (ref 150–400)
RBC: 4.83 MIL/uL (ref 4.22–5.81)
RDW: 13.7 % (ref 11.5–15.5)
WBC: 30.7 10*3/uL — ABNORMAL HIGH (ref 4.0–10.5)
nRBC: 0 % (ref 0.0–0.2)

## 2020-11-21 LAB — LACTIC ACID, PLASMA
Lactic Acid, Venous: 1.6 mmol/L (ref 0.5–1.9)
Lactic Acid, Venous: 1.2 mmol/L (ref 0.5–1.9)

## 2020-11-21 LAB — URINALYSIS, ROUTINE W REFLEX MICROSCOPIC
Bilirubin Urine: NEGATIVE
Glucose, UA: NEGATIVE mg/dL
Ketones, ur: NEGATIVE mg/dL
Nitrite: NEGATIVE
Protein, ur: 100 mg/dL — AB
RBC / HPF: >50 RBC/hpf — ABNORMAL HIGH (ref 0–5)
Specific Gravity, Urine: 1.045 — ABNORMAL HIGH (ref 1.005–1.030)
pH: 6 (ref 5.0–8.0)

## 2020-11-21 LAB — COMPREHENSIVE METABOLIC PANEL
ALT: 19 U/L (ref 0–44)
AST: 20 U/L (ref 15–41)
Albumin: 3.8 g/dL (ref 3.5–5.0)
Alkaline Phosphatase: 105 U/L (ref 38–126)
Anion gap: 10 (ref 5–15)
BUN: 29 mg/dL — ABNORMAL HIGH (ref 6–20)
CO2: 20 mmol/L — ABNORMAL LOW (ref 22–32)
Calcium: 9 mg/dL (ref 8.9–10.3)
Chloride: 107 mmol/L (ref 98–111)
Creatinine, Ser: 0.99 mg/dL (ref 0.61–1.24)
GFR, Estimated: >60 mL/min (ref >60)
Glucose, Bld: 129 mg/dL — ABNORMAL HIGH (ref 70–99)
Potassium: 2.9 mmol/L — ABNORMAL LOW (ref 3.5–5.1)
Sodium: 137 mmol/L (ref 135–145)
Total Bilirubin: 0.7 mg/dL (ref 0.3–1.2)
Total Protein: 6.9 g/dL (ref 6.5–8.1)

## 2020-11-21 LAB — RESP PANEL BY RT-PCR (FLU A&B, COVID) ARPGX2
Influenza A by PCR: NEGATIVE
Influenza B by PCR: NEGATIVE
SARS Coronavirus 2 by RT PCR: NEGATIVE

## 2020-11-21 LAB — PROTIME-INR
INR: 1.2 (ref 0.8–1.2)
Prothrombin Time: 14.6 seconds (ref 11.4–15.2)

## 2020-11-21 LAB — MAGNESIUM: Magnesium: 2 mg/dL (ref 1.7–2.4)

## 2020-11-21 LAB — APTT: aPTT: 52 seconds — ABNORMAL HIGH (ref 24–36)

## 2020-11-21 MED ORDER — ONDANSETRON HCL 4 MG PO TABS: 4.0000 mg | ORAL_TABLET | Freq: Four times a day (QID) | ORAL | Status: DC | PRN

## 2020-11-21 MED ORDER — ACETAMINOPHEN 325 MG PO TABS
650.0000 mg | ORAL_TABLET | Freq: Four times a day (QID) | ORAL | Status: DC | PRN
  Administered 2020-11-21: 650 mg via ORAL

## 2020-11-21 MED ORDER — OXYCODONE HCL 5 MG PO TABS: 5.0000 mg | ORAL_TABLET | ORAL | Status: DC | PRN

## 2020-11-21 MED ORDER — ENOXAPARIN SODIUM 40 MG/0.4ML ~~LOC~~ SOLN
40.0000 mg | SUBCUTANEOUS | Status: DC
  Administered 2020-11-21 – 2020-11-22 (×2): 40 mg via SUBCUTANEOUS
  Filled 2020-11-21 (×2): qty 0.4

## 2020-11-21 MED ORDER — POTASSIUM CHLORIDE IN NACL 40-0.9 MEQ/L-% IV SOLN
INTRAVENOUS | Status: AC
  Filled 2020-11-21 (×4): qty 1000

## 2020-11-21 MED ORDER — SODIUM CHLORIDE 0.9 % IV BOLUS
500.0000 mL | Freq: Once | INTRAVENOUS | Status: AC
  Administered 2020-11-21: 500 mL via INTRAVENOUS

## 2020-11-21 MED ORDER — MIDAZOLAM HCL 2 MG/2ML IJ SOLN
INTRAMUSCULAR | Status: AC | PRN
  Administered 2020-11-21 (×2): 1 mg via INTRAVENOUS

## 2020-11-21 MED ORDER — BACLOFEN 10 MG PO TABS
10.0000 mg | ORAL_TABLET | ORAL | Status: DC
Start: 2020-11-21 — End: 2020-11-21

## 2020-11-21 MED ORDER — FENTANYL CITRATE (PF) 100 MCG/2ML IJ SOLN
INTRAMUSCULAR | Status: AC | PRN
  Administered 2020-11-21: 50 ug via INTRAVENOUS

## 2020-11-21 MED ORDER — ONDANSETRON HCL 4 MG/2ML IJ SOLN: 4.0000 mg | Freq: Four times a day (QID) | INTRAMUSCULAR | Status: DC | PRN

## 2020-11-21 MED ORDER — VANCOMYCIN HCL 1000 MG/200ML IV SOLN
1000.0000 mg | Freq: Two times a day (BID) | INTRAVENOUS | Status: DC
  Administered 2020-11-22 – 2020-11-23 (×4): 1000 mg via INTRAVENOUS
  Filled 2020-11-21 (×5): qty 200

## 2020-11-21 MED ORDER — SODIUM CHLORIDE 0.9 % IV SOLN
400.0000 mg | Freq: Once | INTRAVENOUS | Status: AC
  Administered 2020-11-21: 400 mg via INTRAVENOUS
  Filled 2020-11-21 (×2): qty 4

## 2020-11-21 MED ORDER — LACTATED RINGERS IV BOLUS (SEPSIS)
1000.0000 mL | Freq: Once | INTRAVENOUS | Status: AC
  Administered 2020-11-21: 1000 mL via INTRAVENOUS

## 2020-11-21 MED ORDER — MIDAZOLAM HCL 2 MG/2ML IJ SOLN
INTRAMUSCULAR | Status: AC
  Filled 2020-11-21: qty 4

## 2020-11-21 MED ORDER — POTASSIUM CHLORIDE 10 MEQ/100ML IV SOLN
10.0000 meq | INTRAVENOUS | Status: AC
  Administered 2020-11-21 (×4): 10 meq via INTRAVENOUS
  Filled 2020-11-21 (×6): qty 100

## 2020-11-21 MED ORDER — ACETAMINOPHEN 650 MG RE SUPP
650.0000 mg | Freq: Four times a day (QID) | RECTAL | Status: DC | PRN
  Filled 2020-11-21: qty 1

## 2020-11-21 MED ORDER — FENTANYL CITRATE (PF) 100 MCG/2ML IJ SOLN
INTRAMUSCULAR | Status: AC
  Filled 2020-11-21: qty 4

## 2020-11-21 MED ORDER — SODIUM CHLORIDE 0.9 % IV SOLN
1.0000 g | INTRAVENOUS | Status: DC
  Administered 2020-11-21: 1 g via INTRAVENOUS
  Filled 2020-11-21 (×2): qty 10
  Filled 2020-11-21: qty 1

## 2020-11-21 MED ORDER — VANCOMYCIN HCL 1250 MG/250ML IV SOLN
1250.0000 mg | Freq: Once | INTRAVENOUS | Status: AC
  Administered 2020-11-21: 1250 mg via INTRAVENOUS
  Filled 2020-11-21: qty 250

## 2020-11-21 MED ORDER — BACLOFEN 20 MG PO TABS
20.0000 mg | ORAL_TABLET | Freq: Every day | ORAL | Status: DC
  Administered 2020-11-22 – 2020-11-23 (×2): 20 mg via ORAL
  Filled 2020-11-21 (×2): qty 1

## 2020-11-21 MED ORDER — ORAL CARE MOUTH RINSE
15.0000 mL | Freq: Two times a day (BID) | OROMUCOSAL | Status: DC
  Administered 2020-11-21 – 2020-11-23 (×6): 15 mL via OROMUCOSAL

## 2020-11-21 MED ORDER — LACTATED RINGERS IV SOLN: INTRAVENOUS | Status: DC

## 2020-11-21 MED ORDER — SODIUM CHLORIDE 0.9 % IV SOLN
INTRAVENOUS | Status: AC
  Filled 2020-11-21: qty 250

## 2020-11-21 MED ORDER — BACLOFEN 10 MG PO TABS
10.0000 mg | ORAL_TABLET | Freq: Two times a day (BID) | ORAL | Status: DC
  Administered 2020-11-22 – 2020-11-23 (×2): 10 mg via ORAL
  Filled 2020-11-21 (×3): qty 1

## 2020-11-21 MED ORDER — LIDOCAINE HCL (PF) 1 % IJ SOLN
INTRAMUSCULAR | Status: AC | PRN
  Administered 2020-11-21: 10 mL via INTRADERMAL

## 2020-11-21 NOTE — Progress Notes (Signed)
Patient received antibiotics at previous hospital

## 2020-11-21 NOTE — Progress Notes (Signed)
Following for code sepsis 

## 2020-11-21 NOTE — Progress Notes (Signed)
[facility] will be following for Sepsis Protocol

## 2020-11-21 NOTE — H&P (Addendum)
History and Physical  Patient Name: Dennis Mann     QIH:[id number]    DOB: 1975-09-26    DOA: 11/21/2020 PCP: Crist Fat, MD  Patient coming from: Home -> [facility]  Chief Complaint: Hematuria      HPI: Dennis Mann is a 45 y.o. M with hx advanced secondary progressive MS, wheelchair bound x45yrs who presented with hematuria.  Patient recently had been enrolled in Hospice.  As part of their intake, the Hospice RN had recommended transitioning from CIC to indwelling foley.  When she went to place the foley however, no urine returned.  Mother subsequently deflated the bulb and removed the foley after which the patient had bleeding from the penis, and went to the ER.  In the ER, there was bleeding from the penis, fever, and was given a antibiotics.  Tthe patient was  thentransferred to [facility] for Urology consultation.  Here, Hgb stable at 13. Potassium low, WBC elevated.  Lactate normal, Cr normal.  Urology attempted to place foley at bedside, unable, attempted cystoscopy and were unable.  They consulted IR who subsequently placed a suprapubic catheter.            ROS: Review of Systems  Unable to perform ROS: Patient nonverbal    Per mother, there was hematuria, only.  Per OSH, he had fever.  Paitent has a very limited verbal repertoire, but seems to be at baseline.  Denies headache, chest symptoms, respiratory symptoms, abdominal pain (other than can tell his new SP catheter is in place) nor constitutional symptoms or vomiting.      Past Medical History:  Diagnosis Date  . Anal fissure   . Cognitive impairment   . Essential hypertension   . Hyperlipidemia   . Multiple sclerosis (HCC)     History reviewed. No pertinent surgical history.  Social History: Patient lives with mother, previously lived in [place] with father for years.  The patient has been bed bound for several years.  Was a marijuana smoker for years, now nonsmoker.  No Known Allergies  Family  history: family history includes Colon cancer in his paternal grandmother; Diabetes in his paternal grandfather; Heart disease in his maternal grandfather.  Prior to Admission medications   Medication Sig Start Date End Date Taking? Authorizing Provider  ALPRAZolam Prudy Feeler) 0.5 MG tablet Take 0.25 mg by mouth every 12 (twelve) hours as needed for anxiety. 10/29/20  Yes [provider]  baclofen (LIORESAL) 10 MG tablet Take 10 mg in the morning, 10 mg in the afternoon and 20 mg at night Patient taking differently: Take 10 mg by mouth See admin instructions. Take 10 mg in the morning, 10 mg in the afternoon and 20 mg at night 08/17/20  Yes Sater, Pearletha Furl, MD  losartan-hydrochlorothiazide (HYZAAR) 50-12.5 MG tablet Take 1 tablet by mouth daily.   Yes [provider]  methenamine (HIPREX) 1 g tablet Take 1 g by mouth 2 (two) times daily. 11/02/20  Yes [provider]  DULCOLAX 10 MG suppository Place 10 mg rectally daily as needed for constipation. No bm in 3 days 11/20/20   [provider]  ergocalciferol (VITAMIN D2) 1.25 MG (50000 UT) capsule Take 50,000 Units by mouth once a week.    [provider]  Morphine Sulfate (MORPHINE CONCENTRATE) 10 mg / 0.5 ml concentrated solution Take 0.25-0.5 mLs by mouth every 2 (two) hours as needed for pain or shortness of breath. 11/20/20   [provider]  ondansetron (ZOFRAN) 4 MG  tablet Take 4 mg by mouth every 4 (four) hours as needed for nausea/vomiting. 11/20/20   [provider]       Physical Exam: BP 106/80   Pulse (!) 109   Temp 97.7 F (36.5 C)   Resp 15   SpO2 100%  General appearance: Adult male, awake, makes eye contact, no obvious distress.   Eyes: Anicteric, conjunctiva pink, lids and lashes normal. PERRL.    ENT: No nasal deformity, discharge, epistaxis. Denetiion normal, lips normal. OP moist without lesions.   Neck: No neck masses.  Trachea midline.  No  thyromegaly/tenderness. Lymph: No cervical or supraclavicular lymphadenopathy. Skin: Warm and dry.  No jaundice.  No suspicious rashes or lesions. Cardiac: RRR, nl S1-S2, no murmurs appreciated.  Capillary refill is brisk.  JVP normal.  No LE edema.  Radial and DP pulses 2+ and symmetric. Respiratory: Normal respiratory rate and rhythm.  CTAB without rales or wheezes. Abdomen: Abdomen soft.  No TTP or guarding.  New SP catheter in place. No ascites, distension, hepatosplenomegaly.   MSK: No deformities or effusions of the large joints of the upper or lower extremities bilaterally.  No cyanosis or clubbing.  Contracturs in all four extremities. Neuro: Some diplopia. Speech is slow, halting and dysfluent.  Muscle strength impaired in all four, seems worse on right.    Psych: Sensorium intact and responding to questions slowly.  Attention dimininsed.  Only intellgible verbalizations are "no" (which is appropriate in response to ROS quetsions) and "can I have a pepsi?"      Labs on Admission:  I have personally reviewed following labs and imaging studies: CBC: Recent Labs  Lab 11/21/20 0753  WBC 30.7*  NEUTROABS 27.5*  HGB 13.5  HCT 40.3  MCV 83.4  PLT 218   Basic Metabolic Panel: Recent Labs  Lab 11/21/20 0753  NA 137  K 2.9*  CL 107  CO2 20*  GLUCOSE 129*  BUN 29*  CREATININE 0.99  CALCIUM 9.0   GFR: CrCl cannot be calculated (Unknown ideal weight.).  Liver Function Tests: Recent Labs  Lab 11/21/20 0753  AST 20  ALT 19  ALKPHOS 105  BILITOT 0.7  PROT 6.9  ALBUMIN 3.8   No results for input(s): LIPASE, AMYLASE in the last 168 hours. No results for input(s): AMMONIA in the last 168 hours. Coagulation Profile: Recent Labs  Lab 11/21/20 0753  INR 1.2   Sepsis Labs: Lactate normal  Recent Results (from the past 240 hour(s))  Resp Panel by RT-PCR (Flu A&B, Covid) Nasopharyngeal Swab     Status: None   Collection Time: 11/21/20  9:50 AM   Specimen:  Nasopharyngeal Swab; Nasopharyngeal(NP) swabs in vial transport medium  Result Value Ref Range Status   SARS Coronavirus 2 by RT PCR NEGATIVE NEGATIVE Final    Comment: (NOTE) SARS-CoV-2 target nucleic acids are NOT DETECTED.  The SARS-CoV-2 RNA is generally detectable in upper respiratory specimens during the acute phase of infection. The lowest concentration of SARS-CoV-2 viral copies this assay can detect is 138 copies/mL. A negative result does not preclude SARS-Cov-2 infection and should not be used as the sole basis for treatment or other patient management decisions. A negative result may occur with  improper specimen collection/handling, submission of specimen other than nasopharyngeal swab, presence of viral mutation(s) within the areas targeted by this assay, and inadequate number of viral copies(<138 copies/mL). A negative result must be combined with clinical observations, patient history, and epidemiological information. The expected result is Negative.  Fact Sheet for Patients:  BloggerCourse.com  Fact Sheet for Healthcare Providers:  SeriousBroker.it  This test is no t yet approved or cleared by the [place] FDA and  has been authorized for detection and/or diagnosis of SARS-CoV-2 by FDA under an Emergency Use Authorization (EUA). This EUA will remain  in effect (meaning this test can be used) for the duration of the COVID-19 declaration under Section 564(b)(1) of the Act, 21 U.S.C.section 360bbb-3(b)(1), unless the authorization is terminated  or revoked sooner.       Influenza A by PCR NEGATIVE NEGATIVE Final   Influenza B by PCR NEGATIVE NEGATIVE Final    Comment: (NOTE) The Xpert Xpress SARS-CoV-2/FLU/RSV plus assay is intended as an aid in the diagnosis of influenza from Nasopharyngeal swab specimens and should not be used as a sole basis for treatment. Nasal washings and aspirates are unacceptable for  Xpert Xpress SARS-CoV-2/FLU/RSV testing.  Fact Sheet for Patients: BloggerCourse.com  Fact Sheet for Healthcare Providers: SeriousBroker.it  This test is not yet approved or cleared by the [place] FDA and has been authorized for detection and/or diagnosis of SARS-CoV-2 by FDA under an Emergency Use Authorization (EUA). This EUA will remain in effect (meaning this test can be used) for the duration of the COVID-19 declaration under Section 564(b)(1) of the Act, 21 U.S.C. section 360bbb-3(b)(1), unless the authorization is terminated or revoked.  Performed at [facility], [address] [place], [place] [address]            Radiological Exams on Admission: Personally reviewed CXR without pneumonia or edema. No sizeable effusion on my read.   DG Chest Port 1 View  Result Date: 11/21/2020 CLINICAL DATA:  Questionable sepsis. EXAM: PORTABLE CHEST 1 VIEW COMPARISON:  07/26/2020. FINDINGS: Mediastinum and hilar structures normal. Heart size normal. Low lung volumes. Previously identified right base infiltrate noted on prior chest x-ray of 07/26/2020 has resolved. Tiny right pleural effusion noted. No pneumothorax. Contrast noted in the kidneys from prior abdominal CT. No acute bony abnormality. IMPRESSION: Low lung volumes. Previously identified right base infiltrate noted on prior chest x-ray of 07/26/2020 has resolved. Tiny right pleural effusion noted Electronically Signed   By: Maisie Fus  Register   On: 11/21/2020 07:49         Assessment/Plan   Urinary retention  -Monitor new SP catheter overnight -Consult Urology  ADDENDUM: Sepsis  Had tachycardia, leukocytosis at admission, and likely UTI by UA.  Fevered again this afternoon to 102F.    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    -Hold methenamine while on CTX  Hypertension BP soft -Hold losartan HCTZ      Multiple sclerosis  Has a non-relapsing secondary progressive MS.  Seems to have advanced to the point that he was enrolled in Hospice.  Based on her immediate experience, mother has lost her conviction that he should be cared for by Hospice.  I discussed goals of care with her extensively and still believe that Hospice is the best route to achieve what she wants for him (comfort, dignity, relief from suffering, not medical cure or aggressive medical treatment), despite this unfortunate foley incident.  As a result, they may want to rescind Hospice.  They  certainly want to be "Full CODE" until they can think about it further.  I will consult Palliative care to elaborate this discussion  -Continue baclofen -Consult Palliative Care            DVT prophylaxis: Lovenox  Code Status: FULL  Family Communication: mother  Disposition Plan: Anticipate IV fluids, K repolacement and empiric antibiotics.  HR is still >100 and BP still soft, so I will monitor on fluids overnight.  If HR resolved, culture is negative, will be able to rule out sepsis and may be able to discharge tomorrow with new SP catheter.  Otherwise, will need to treat as sepsis and continue antibiotics until speciation available. Consults called: Urology, IR Admission status: OBS   At the point of initial evaluation, it is my clinical opinion that admission for OBSERVATION is reasonable and necessary because the patient's presenting complaints in the context of their chronic conditions represent sufficient risk of deterioration or significant morbidity to constitute reasonable grounds for close observation in the hospital setting, but that the patient may be  medically stable for discharge from the hospital within 24 to 48 hours.    Medical decision making: Patient seen at 10:00 AM on 11/21/2020.  The patient was discussed with Dr. Rhunette Croftnanavati.  What exists of the patient's chart was reviewed in depth and summarized above.  Clinical condition: stable.        Earl Liteshristopher P Flossie Wexler [facility] Please page though AMION or Epic secure chat:  For password, contact charge nurse

## 2020-11-21 NOTE — Consult Note (Signed)
Chief Complaint: Patient was seen in consultation today for CT-guided suprapubic catheter placement Chief Complaint  Patient presents with  . Urology Issue    Referring Physician(s): Pace,M  Supervising Physician: Malachy Moan  Patient Status: [facility] - ED  History of Present Illness: Dennis Mann is a 45 y.o. male with history of end-stage multiple sclerosis and neurogenic bladder managed with CIC.Patient's caregiver is his mom. They were looking into transition to hospice early yesterday and hospice nurse attempted to place Foley catheter.  It appears that Foley catheter was inflated in prostatic urethra.  It stayed this way for approximately 4 hours and when deflated copious blood was seen from urethral meatus.  Patient then presented to [facility] emergency room.  There was 3 attempts with Foley catheter in the ER however unsuccessful.  He was then transferred to [facility] for further management.  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  Request now received from urology for CT-guided suprapubic catheter placement.  Patient's white count noted to be 30.7, potassium 2.9, creatinine normal ;he is currently afebrile and slightly tachycardic.  No past medical history on file.  No past surgical history on file.  Allergies: Patient has no known allergies.  Medications: Prior to Admission medications   Medication Sig Start Date End Date Taking? Authorizing Provider  ALPRAZolam Prudy Feeler) 0.5 MG tablet Take 0.25 mg by mouth every 12 (twelve) hours as needed for anxiety.  10/29/20  Yes [provider]  baclofen (LIORESAL) 10 MG tablet Take 10 mg in the morning, 10 mg in the afternoon and 20 mg at night Patient taking differently: Take 10 mg by mouth See admin instructions. Take 10 mg in the morning, 10 mg in the afternoon and 20 mg at night 08/17/20  Yes Sater, Pearletha Furl, MD  losartan-hydrochlorothiazide (HYZAAR) 50-12.5 MG tablet Take 1 tablet by mouth daily.   Yes [provider]  methenamine (HIPREX) 1 g tablet Take 1 g by mouth 2 (two) times daily. 11/02/20  Yes [provider]  atorvastatin (LIPITOR) 10 MG tablet Take 10 mg by mouth daily.    [provider]  DULCOLAX 10 MG suppository Place 10 mg rectally daily as needed for constipation. No bm in 3 days 11/20/20   [provider]  ergocalciferol (VITAMIN D2) 1.25 MG (50000 UT) capsule Take 50,000 Units by mouth once a week.    [provider]  Morphine Sulfate (MORPHINE CONCENTRATE) 10 mg / 0.5 ml concentrated solution Take 0.25-0.5 mLs by mouth every 2 (two) hours as needed for pain or shortness of breath. 11/20/20   [provider]  OLANZapine (ZYPREXA) 5 MG tablet Take 1 tablet (5 mg total) by mouth at bedtime. Patient not taking: No sig reported 10/11/20   Sater, Pearletha Furl, MD  ondansetron (ZOFRAN) 4 MG tablet Take 4 mg by mouth every 4 (four) hours as needed for nausea/vomiting. 11/20/20   [provider]     No family history on file.  Social History   Socioeconomic History  . Marital status: Single    Spouse name: Not on file  . Number of  children: Not on file  . Years of education: Not on file  . Highest education level: Not on file  Occupational History  . Not on file  Tobacco Use  . Smoking status: Not on file  . Smokeless tobacco: Not on file  Substance and Sexual Activity  . Alcohol use: Not on file  . Drug use: Not on file  . Sexual activity: Not on file  Other Topics Concern  . Not on file  Social History Narrative    Lives   Caffeine use:    Social Determinants of Health   Financial Resource Strain: Not on file  Food Insecurity: Not on file  Transportation Needs: Not on file  Physical Activity: Not on file  Stress: Not on file  Social Connections: Not on file      Review of Systems:  currently without fever, headache, respiratory issues, nausea, vomiting or bleeding.  He does have significant lower abdominal discomfort and bladder distention.  Vital Signs: BP 106/86   Pulse (!) 109   Temp 98.9 F (37.2 C) (Oral)   Resp 19   SpO2 96%   Physical Exam awake, does answer some simple questions okay.  Appears slightly lethargic.  Chest with slight diminished sounds right base, left clear.  Heart with tachycardic but regular rhythm.  Abdomen soft, lower abdominal/bladder distention noted, tender to palpation.  No lower extremity edema.  Imaging: DG Chest Port 1 View  Result Date: 11/21/2020 CLINICAL DATA:  Questionable sepsis. EXAM: PORTABLE CHEST 1 VIEW COMPARISON:  07/26/2020. FINDINGS: Mediastinum and hilar structures normal. Heart size normal. Low lung volumes. Previously identified right base infiltrate noted on prior chest x-ray of 07/26/2020 has resolved. Tiny right pleural effusion noted. No pneumothorax. Contrast noted in the kidneys from prior abdominal CT. No acute bony abnormality. IMPRESSION: Low lung volumes. Previously identified right base infiltrate noted on prior chest x-ray of 07/26/2020 has resolved. Tiny right pleural effusion noted Electronically Signed   By: Maisie Fus  Register   On: 11/21/2020 07:49    Labs:  CBC: Recent Labs    07/26/20 2359 11/21/20 0753  WBC 17.0* 30.7*  HGB 12.5* 13.5  HCT 37.7* 40.3  PLT 423* 218    COAGS: Recent Labs    11/21/20 0753  INR 1.2  APTT 52*    BMP: Recent Labs    07/26/20 2359 11/21/20 0753  NA 133* 137  K 3.1* 2.9*  CL 97* 107  CO2 23 20*  GLUCOSE 119* 129*  BUN 16 29*  CALCIUM 8.9 9.0  CREATININE 0.96 0.99   GFRNONAA >60 >60    LIVER FUNCTION TESTS: Recent Labs    11/21/20 0753  BILITOT 0.7  AST 20  ALT 19  ALKPHOS 105  PROT 6.9  ALBUMIN 3.8    TUMOR MARKERS: No results for input(s): AFPTM, CEA, CA199, CHROMGRNA in the last 8760 hours.  Assessment and Plan: 45 y.o. male with history of end-stage multiple sclerosis and neurogenic bladder managed with CIC.Patient's caregiver is his mom. They were looking into transition to hospice early yesterday and hospice nurse attempted to place Foley catheter.  It appears that Foley catheter was inflated in prostatic urethra.  It stayed this way for approximately 4 hours and when deflated copious blood was seen from urethral meatus.  Patient then presented to [facility] emergency room.  There was 3 attempts with Foley catheter in the ER however unsuccessful.  He was then transferred to [facility] for further management.  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  Request now received from urology for CT-guided suprapubic catheter placement.  Patient's white count noted to be 30.7, potassium 2.9, creatinine normal ;he is currently afebrile and slightly tachycardic.  Details/risks of above procedure, including but not limited to, internal bleeding, infection, injury to adjacent structures discussed with patient and patient's mother Dennis Mann with her understanding and consent.   Thank you for this interesting consult.  I greatly enjoyed meeting Dennis Mann and look forward to participating in their care.  A copy of this report was sent to the  requesting provider on this date.  Electronically Signed: D. Jeananne Rama, PA-C 11/21/2020, 8:29 AM   I spent a total of 25 minutes    in face to face in clinical consultation, greater than 50% of which was counseling/coordinating care for CT-guided suprapubic catheter placement

## 2020-11-21 NOTE — Progress Notes (Signed)
   11/21/20 1501  Assess: MEWS Score  Temp (!) 102.9 F (39.4 C)  BP 133/72  Pulse Rate (!) 141  Resp (!) 24  SpO2 95 %  O2 Device Room Air  Assess: MEWS Score  MEWS Temp 2  MEWS Systolic 0  MEWS Pulse 3  MEWS RR 1  MEWS LOC 0  MEWS Score 6  MEWS Score Color Red  Assess: if the MEWS score is Yellow or Red  Were vital signs taken at a resting state? Yes  Focused Assessment Change from prior assessment (see assessment flowsheet)  Early Detection of Sepsis Score *See Row Information* Low  MEWS guidelines implemented *See Row Information* Yes  Treat  MEWS Interventions Administered prn meds/treatments  Take Vital Signs  Increase Vital Sign Frequency  Red: Q 1hr X 4 then Q 4hr X 4, if remains red, continue Q 4hrs  Escalate  MEWS: Escalate Red: discuss with charge nurse/RN and provider, consider discussing with RRT  Notify: Charge Nurse/RN  Name of Charge Nurse/RN Notified Marissa, RN  Date Charge Nurse/RN Notified 11/21/20  Time Charge Nurse/RN Notified 1510  Notify: Provider  Provider Name/Title C. Maryfrances Bunnell, MD  Date Provider Notified 11/21/20  Time Provider Notified 1505  Notification Type Page  Notification Reason Change in status

## 2020-11-21 NOTE — ED Triage Notes (Signed)
Pt arrived via [facility], sent for urology consult. Normally straight cathed at home by family. Had foley placed today for comfort by hospice, no urine output, sent to ED. Unable to place foley in ED. Blood noticed at site.

## 2020-11-21 NOTE — Progress Notes (Signed)
Procedure note  Preprocedure diagnosis:  1.  Neurogenic bladder 2.  Traumatic Foley 3.   Urinary retention  Postoperative diagnosis: 1.  Neurogenic bladder 2.  Traumatic Foley 3.   Urinary retention   Procedure(s): 1.  Urethroscopy with attempted Foley catheter placement over wire 2.  Attempted bedside SP tube placement  Surgeon: Jacalyn Lefevre, MD  Assistants:  None  Anesthesia:  Local  Complications:  None  EBL:  minimal  Drains/Catheters: 1.  nonoe  Findings:   1. 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 Multiple attempts at placing 16 [place] as well as 16 French coud Foley over a wire unsuccessful 4. No return of urine with spinal needle at attempt at bedside SP tube placement  Indication:  Dennis Mann is a 45 y.o. male with history of end-stage MS and neurogenic bladder that was previously being catheterized by his caregiver.  He was transitioning to hospice and a nurse placed a Foley catheter and balloon was inflated and prostatic urethra.  Description of procedure: After risks and benefits of the above procedures were discussed with the patient's mom verbal consent was given to proceed.  Patient was prepped and draped in the usual sterile fashion.  A 17 French flexible cystoscope was placed in the urethral meatus.  Initially resistance was met in the penile urethra due to strictured area.  The lumen was visible and gentle pressure applied to allow the scope to travel to the prostatic urethra.  At this point in time several false passages as well as blood was seen.  The true lumen was seen at 12:00 and a wire was placed into this lumen.  I was able to follow the wire into the bladder with the camera.  Once wire was confirmed in the bladder the cystoscope was removed.  Attempts at placing a 16 [place] council tip Foley over  the wire was unsuccessful.  Also attempted placing a 16 French coud Foley with a hole at the end over a wire also unsuccessful.  The decision was made to stop the procedure.  At this point in time I discussed the risks and benefits of a bedside SP tube placement versus image guided SP tube placement with interventional radiology.  The patient's mother wanted to proceed with bedside SP tube placement.  He was prepped and draped in usual sterile fashion.  No urine return was seen with spinal needle after several attempts at placing the spinal needle.  The decision was made to stop the procedure and proceed with image guided SP tube placement.    Plan: The patient will be admitted to medicine for sepsis.  I have called interventional radiology and requested their assistance for SP tube placement.  Patient wishes to follow-up with urologist in [place] as transfer and getting to appointments is very difficult.

## 2020-11-21 NOTE — Progress Notes (Addendum)
   Mr. Dennis Mann is a current pt with [facility]. He was just admitted to home care services yesterday 11/20/20 for a primary diagnosis of Multiple Sclerosis. PMH includes but not limited to: anxiety, depression, ADHD, Recurrent UTIs and PNA, Chronic vein insufficiency, HLD, Spinal stenosis, Chronic back pain, GERD. Pt lives with his mother who is the Primary care giver.She on admission yesterday had been doing I/O cath and stated how difficult is was getting to do these. Our nurse placed a 102F foley catheter on admission without difficulty.   He was orginally referred to hospice services from his PCP for past history of multiple hospitalization over last year mostly related to UTI and PNA. He has only lived in [place] since October 2021 moving here from [place]. Since moving pt's mom has noticed a continual decline. In the last several weeks pt has had sharp decline. Last week pt was able to feed self. He is now unable to feed self and refusing most food and liquids. His diet has been modified to Thickened liquids and mech soft diet and continues to have difficulty with swallowing. Pt has also had a cognitive decline. He is obtunded some of the time and at other he just repeats what is said to him. Dennis Mann also describes an increase in agitation and restlessness. Medications added yesterday for sx managemnt morphine sulfate elixir, senna s, ondansetron and alprazolam for sx management needs at home. (morphine unknown if pt's family picked up or not).   Around 2215 last night we got a call from the pt's mother stating stating that the pt had a catheter placed on admission by our nurse and that she has removed it because it was bothering him and she did not feel it was draining good. She stated that it has bleeding ever since. She reported pt is unable to urinate on his own and she is concerned about doing I/O cath b/c of the bleeding. She wants pt to be evaluated at ED. She let us know  that she had already called EMS. Pt ws taken to [facility].   Please reach out as we work together to provide care for pt. If questions or concerns.   Norm Parcel RN BSN [facility] [phone]

## 2020-11-21 NOTE — Evaluation (Signed)
Clinical/Bedside Swallow Evaluation Patient Details  Name: Dennis Mann MRN: [id number] Date of Birth: [birth year]  Today's Date: 11/21/2020 Time: SLP Start Time (ACUTE ONLY): 1901 SLP Stop Time (ACUTE ONLY): 1930 SLP Time Calculation (min) (ACUTE ONLY): 29 min  Past Medical History:  Past Medical History:  Diagnosis Date  . Anal fissure   . Cognitive impairment   . Essential hypertension   . Hyperlipidemia   . Multiple sclerosis (HCC)    Past Surgical History: History reviewed. No pertinent surgical history. HPI:  Dennis Mann is a 45 y.o. M with hx advanced secondary progressive MS, wheelchair bound x41yrs who presented with hematuria.  Pt has been diagnosed with MS since the age of 45.   He had resided with his father after his mom moved to [place] approx 3 years ago - but recently relocated to [facility] with his mother.  Pt has dysphagia and uses thickened liquids with soft foods - except he drinks thin Pepsis.  Has h/o aspiration pnas.  Swallow eval ordered.   Assessment / Plan / Recommendation Clinical Impression  Initially, pt fully alert upon initiation of evaluation, however mentation wavered and pt nodded off during interview process.  He is very deconditioned and presents with wet gurgly voice - concerning for secretion aspiration.  Suction canister on wall noted to contain dark fluid and frothy secretions. Pt desired ice chip and SLP repositioned pt - providing him with single ice chip.  Delayed swallow followed by overt coughing post-swallow without oral expectoration.  SLP ceased furhter po trials due to pt's mentation, overt aspiration and current full code status.    Then SLP spoke to pt's father in the room and pt's mother via cell phone (on speaker).  Per hx from father, pt has h/o aspiration c/b coughing with intake that has progressed in the last year - with asp pna last year.  Dysphagia was initially diagnosed in 2018 in [place].  Pt has been receiving thickened liquids since October of  2021 per neurology recommendations per pt's mother.  She admits pt has had decreased coughing with thicker liquids.  Upon asking re: premorbid status,  pt's 'mother states pt has been lethargic in the last 3 weeks with minimal intake over the last week.  She expresses more concern with pt having a fever re: his outcomes- stating pt had fevers in November and with this current event.     Pt was recently accepted with hospice OP.  SLP agrees that pt should be npo except single ice chip - dipped in soda for comfort - Ice chips only after oral care, with pt fully alert and upright.  Recommend HOB not be below 30* due to secretion aspiration.  If pt's mentation improves to allow participation in [facility], it may be beneficial to his treatment plan - however pt must be fully alert for evaluation. Shared recommendations with pt's father and mother who are both agreeable to plan.  SLP informed of concern that pt's swallow may not return to a functional level given h/o dysphagia with progressive MS and decreased functional reserve.  Pt's mother reported understanding to information.   Pt wanted "Pepsi" upon SLP return to post swallow precaution signs in room, NT taking temperature.  Advised pt he could only have a SINGLE ICE chip dipped in soda due to concerns for overt aspiration.  Pt stated "That would be ok."      SLP Visit Diagnosis: Dysphagia, oropharyngeal phase (R13.12)    Aspiration Risk  Severe aspiration risk;Risk for  inadequate nutrition/hydration    Diet Recommendation NPO;Ice chips PRN after oral care   Medication Administration: Via alternative means Postural Changes: Seated upright at 90 degrees;Remain upright for at least 30 minutes after po intake    Other  Recommendations Oral Care Recommendations: Oral care QID;Oral care prior to ice chip/H20   Follow up Recommendations Other (comment) (TBD)      Frequency and Duration min 1 x/week  2 weeks       Prognosis Prognosis for Safe Diet  Advancement: Guarded Barriers to Reach Goals: Other (Comment);Severity of deficits (progressive nature of pt's disease process)      Swallow Study   General Date of Onset: 11/21/20 HPI: Dennis Mann is a 45 y.o. M with hx advanced secondary progressive MS, wheelchair bound x47yrs who presented with hematuria.  Pt has been diagnosed with MS since the age of 45.   He had resided with his father after his mom moved to  approx 3 years ago - but recently relocated to [facility] with his mother.  Pt has dysphagia and uses thickened liquids with soft foods - except he drinks thin Pepsis.  Has h/o aspiration pnas.  Swallow eval ordered. Type of Study: Bedside Swallow Evaluation Diet Prior to this Study: NPO Temperature Spikes Noted: Yes Respiratory Status: Room air History of Recent Intubation: No Behavior/Cognition: Lethargic/Drowsy Oral Cavity Assessment: Other (comment) (minimal amount of viscous secretions retained in oral cavity) Oral Care Completed by SLP: No Oral Cavity - Dentition: Adequate natural dentition Vision: Impaired for self-feeding Self-Feeding Abilities: Total assist Patient Positioning: Upright in bed Baseline Vocal Quality: Low vocal intensity;Other (comment);Wet Volitional Cough: Weak Volitional Swallow: Unable to elicit    Oral/Motor/Sensory Function Overall Oral Motor/Sensory Function: Generalized oral weakness (significant generalized oral weakness)   Ice Chips Ice chips: Impaired Presentation: Spoon Pharyngeal Phase Impairments: Cough - Immediate   Thin Liquid Thin Liquid: Not tested    Nectar Thick Nectar Thick Liquid: Not tested   Honey Thick Honey Thick Liquid: Not tested   Puree Puree: Not tested   Solid     Solid: Not tested      Dennis Mann 11/21/2020,8:27 PM  Dennis Infante, MS [facility] SLP Acute Rehab Services Office [phone] Pager [phone]

## 2020-11-21 NOTE — Progress Notes (Signed)
Notified bedside nurse of need to draw lactic acid and blood cultures.,  then give antibiotics °

## 2020-11-21 NOTE — Sepsis Progress Note (Signed)
Rocephin ordered late,  The nurse is waiting for pharmacy to send. Thank you

## 2020-11-21 NOTE — ED Provider Notes (Signed)
  Physical Exam  BP 101/74   Pulse (!) 113   Temp 98.9 F (37.2 C) (Oral)   Resp (!) 22   SpO2 94%   Physical Exam  ED Course/Procedures     Procedures  MDM  CC: Fevers, cath issues. Hospice pt, chronic foley catheter. Had some catheter issue, tried to remove. Now has fever. Blood at meatus.  Urology consulted. Code sepsis initiated.       Derwood Kaplan, MD 11/21/20 [phone]

## 2020-11-21 NOTE — ED Notes (Addendum)
Pt transported to main CT

## 2020-11-21 NOTE — Progress Notes (Signed)
Notified provider of need to order antibiotics.   

## 2020-11-21 NOTE — Procedures (Signed)
Interventional Radiology Procedure Note  Procedure: Placement of a 30F suprapubic catheter  Complications: None  Estimated Blood Loss: None  Recommendations: - Drain to Foley bag - Return to IR in 4-6 weeks for drain exchange and upsize  Signed,  Sterling Big, MD

## 2020-11-21 NOTE — Consult Note (Signed)
I have been asked to see the patient by Dr. Rhunette Croft, for evaluation and management of urinary retention.  History of present illness: 45 year old man with a history of MS and neurogenic bladder managed with [facility].  Patient's caregiver is his mom.  They were looking into transition to hospice early yesterday and hospice nurse attempted to place Foley catheter.  It appears that Foley catheter was inflated in prostatic urethra.  It stayed this way for approximately 4 hours and when deflated copious blood was seen from urethral meatus.  Patient then presented to [facility] emergency room.  There was 3 attempts with Foley catheter in the ER however unsuccessful.  He was then transferred to [facility] for further management.  According to patient's mom, they do not have a urologist.  She has been managing his bladder with [facility] since 2018.  He has end-stage MS. [facility] ER physician noted that bladder scan was over 600 cc over 5 hours ago.   Review of systems: A 12 point comprehensive review of systems was obtained and is negative unless otherwise stated in the history of present illness.  There are no problems to display for this patient.   No current facility-administered medications on file prior to encounter.   Current Outpatient Medications on File Prior to Encounter  Medication Sig Dispense Refill  . atorvastatin (LIPITOR) 10 MG tablet Take 10 mg by mouth daily.    . baclofen (LIORESAL) 10 MG tablet Take 10 mg in the morning, 10 mg in the afternoon and 20 mg at night 120 each 5  . Cyanocobalamin (VITAMIN B-12 PO) Take 1 Dose by mouth daily.    Marland Kitchen docusate sodium (COLACE) 100 MG capsule Take 100 mg by mouth 2 (two) times daily.    . ergocalciferol (VITAMIN D2) 1.25 MG (50000 UT) capsule Take 50,000 Units by mouth once a week.    . gabapentin (NEURONTIN) 300 MG capsule Take 600 mg by mouth 3 (three) times daily.    Marland Kitchen losartan-hydrochlorothiazide (HYZAAR) 50-12.5 MG tablet Take 1 tablet by mouth daily.     . methenamine (MANDELAMINE) 1 g tablet Take 1,000 mg by mouth 2 (two) times daily.    Marland Kitchen OLANZapine (ZYPREXA) 5 MG tablet Take 1 tablet (5 mg total) by mouth at bedtime. 30 tablet 5    No past medical history on file.  No past surgical history on file.     No family history on file.  PE: Vitals:   11/21/20 1610 11/21/20 0626 11/21/20 0626 11/21/20 0730  BP: 101/74   106/86  Pulse: (!) 113   (!) 109  Resp:   (!) 22 19  Temp:  98.9 F (37.2 C)    TempSrc:  Oral    SpO2: 93%  94% 96%   Patient appears to be in no acute distress  Atraumatic normocephalic head No increased work of breathing, no audible wheezes/rhonchi Abdomen is soft, nontender, bladder non-palpable  Circ phallus, blood at meatus Lower extremities are symmetric without appreciable edema No identifiable skin lesions  No results for input(s): WBC, HGB, HCT in the last 72 hours. No results for input(s): NA, K, CL, CO2, GLUCOSE, BUN, CREATININE, CALCIUM in the last 72 hours. No results for input(s): LABPT, INR in the last 72 hours. No results for input(s): LABURIN in the last 72 hours. Results for orders placed or performed during the hospital encounter of 07/26/20  Resp Panel by RT PCR (RSV, Flu A&B, Covid) - Nasopharyngeal Swab     Status: None  Collection Time: 07/27/20 12:06 AM   Specimen: Nasopharyngeal Swab  Result Value Ref Range Status   SARS Coronavirus 2 by RT PCR NEGATIVE NEGATIVE Final    Comment: (NOTE) SARS-CoV-2 target nucleic acids are NOT DETECTED.  The SARS-CoV-2 RNA is generally detectable in upper respiratoy specimens during the acute phase of infection. The lowest concentration of SARS-CoV-2 viral copies this assay can detect is 131 copies/mL. A negative result does not preclude SARS-Cov-2 infection and should not be used as the sole basis for treatment or other patient management decisions. A negative result may occur with  improper specimen collection/handling, submission of specimen  other than nasopharyngeal swab, presence of viral mutation(s) within the areas targeted by this assay, and inadequate number of viral copies (<131 copies/mL). A negative result must be combined with clinical observations, patient history, and epidemiological information. The expected result is Negative.  Fact Sheet for Patients:  https://www.moore.com/  Fact Sheet for Healthcare Providers:  https://www.young.biz/  This test is no t yet approved or cleared by the [place] FDA and  has been authorized for detection and/or diagnosis of SARS-CoV-2 by FDA under an Emergency Use Authorization (EUA). This EUA will remain  in effect (meaning this test can be used) for the duration of the COVID-19 declaration under Section 564(b)(1) of the Act, 21 U.S.C. section 360bbb-3(b)(1), unless the authorization is terminated or revoked sooner.     Influenza A by PCR NEGATIVE NEGATIVE Final   Influenza B by PCR NEGATIVE NEGATIVE Final    Comment: (NOTE) The Xpert Xpress SARS-CoV-2/FLU/RSV assay is intended as an aid in  the diagnosis of influenza from Nasopharyngeal swab specimens and  should not be used as a sole basis for treatment. Nasal washings and  aspirates are unacceptable for Xpert Xpress SARS-CoV-2/FLU/RSV  testing.  Fact Sheet for Patients: https://www.moore.com/  Fact Sheet for Healthcare Providers: https://www.young.biz/  This test is not yet approved or cleared by the [place] FDA and  has been authorized for detection and/or diagnosis of SARS-CoV-2 by  FDA under an Emergency Use Authorization (EUA). This EUA will remain  in effect (meaning this test can be used) for the duration of the  Covid-19 declaration under Section 564(b)(1) of the Act, 21  U.S.C. section 360bbb-3(b)(1), unless the authorization is  terminated or revoked.    Respiratory Syncytial Virus by PCR NEGATIVE NEGATIVE Final     Comment: (NOTE) Fact Sheet for Patients: https://www.moore.com/  Fact Sheet for Healthcare Providers: https://www.young.biz/  This test is not yet approved or cleared by the [place] FDA and  has been authorized for detection and/or diagnosis of SARS-CoV-2 by  FDA under an Emergency Use Authorization (EUA). This EUA will remain  in effect (meaning this test can be used) for the duration of the  COVID-19 declaration under Section 564(b)(1) of the Act, 21 U.S.C.  section 360bbb-3(b)(1), unless the authorization is terminated or  revoked. Performed at [facility], [address], [place], [place] [address]      Imp: 45 year old man with a history of MS and neurogenic bladder with traumatic Foley catheterization and now urinary retention.  Recommendations: -We will attempt to place Foley catheter at the bedside -See urology procedure note to follow   Update: -Patient to be admitted to medicine for sepsis -Unsuccessful attempt at placing urethral Foley catheter as well as bedside SP tube placement -Have consulted interventional radiology for suprapubic tube placement  Thank you for involving me in this patient's care. Please page with any further  questions or concerns. Alanta Scobey D Kapono Luhn

## 2020-11-21 NOTE — ED Provider Notes (Addendum)
[facility]-EMERGENCY DEPT Provider Note   CSN: [id number] Arrival date & time: 11/21/20  8938    History Chief Complaint  Patient presents with  . Urology Issue    [facility] is a 45 y.o. male.  The history is provided by medical records. 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  Patient transferred here from [facility] because of need for urology consultation.  He has end-stage multiple sclerosis and was to be placed in hospice.  He had been having intermittent bladder catheterization but a Foley catheter was placed in anticipation of hospice care.  Evidently, it stopped draining and they were not able to remove it so urologist had recommended cutting the catheter to allow the balloon to drain and then remove it.  When this was done, blood was noted to be coming from the urethral meatus and they were unable to insert another catheter in, and were unable to do a urethrogram.  He was febrile there and he was given antibiotics.  No past medical history on file.  There are no problems to display for this patient.   No past surgical history on file.     No family history on file.     Home Medications Prior to Admission medications   Medication Sig Start Date End Date Taking? Authorizing Provider  atorvastatin (LIPITOR) 10 MG tablet Take 10 mg by mouth daily.    [provider]  baclofen (LIORESAL) 10 MG tablet Take 10 mg in the morning, 10 mg in the afternoon and 20 mg at night 08/17/20   Sater, Richard A, MD  Cyanocobalamin (VITAMIN B-12 PO) Take 1 Dose by mouth daily.    [provider]  docusate sodium (COLACE) 100 MG capsule Take 100 mg by mouth 2 (two) times daily.    [provider]  ergocalciferol (VITAMIN D2) 1.25 MG (50000 UT) capsule Take 50,000 Units by mouth once a week.    [provider]  gabapentin (NEURONTIN)  300 MG capsule Take 600 mg by mouth 3 (three) times daily.    [provider]  losartan-hydrochlorothiazide (HYZAAR) 50-12.5 MG tablet Take 1 tablet by mouth daily.    [provider]  methenamine (MANDELAMINE) 1 g tablet Take 1,000 mg by mouth 2 (two) times daily.    [provider]  OLANZapine (ZYPREXA) 5 MG tablet Take 1 tablet (5 mg total) by mouth at bedtime. 10/11/20   Sater, Pearletha Furl, MD    Allergies    Patient has no known allergies.  Review of Systems   Review of Systems  Unable to perform ROS: Other    Physical Exam Updated Vital Signs BP 101/74   Pulse (!) 113   Temp 98.9 F (37.2 C) (Oral)   Resp (!) 22   SpO2 94%   Physical Exam Vitals and nursing note reviewed.   45 year old male, resting comfortably and in no acute distress. Vital signs are significant for rapid heart rate and slightly elevated respiratory rate. Oxygen saturation is 94%, which is normal. Head is normocephalic and atraumatic. PERRLA, EOMI. Oropharynx is clear. Neck is nontender and supple without adenopathy or JVD. Back is nontender and there is no CVA tenderness. Lungs are clear without rales, wheezes, or rhonchi. Chest is nontender. Heart has regular rate and rhythm without murmur. Abdomen is soft, flat, nontender without masses or hepatosplenomegaly and peristalsis is normoactive. Genitalia: Circumcised penis with blood seen at  the urethral meatus.  Testes are descended without masses.  There is no inguinal adenopathy. Extremities have no cyanosis or edema, full range of motion is present. Skin is warm and dry without rash. Neurologic: Awake and able to answer simple questions.  Cranial nerves are grossly intact.  He has generalized weakness with strength 3/5.  Clonus is present in both ankles.  ED Results / Procedures / Treatments   Labs (all labs ordered are listed, but only abnormal results are displayed) Labs Reviewed  RESP PANEL BY RT-PCR (FLU A&B, COVID)  ARPGX2  CULTURE, BLOOD (ROUTINE X 2)  CULTURE, BLOOD (ROUTINE X 2)  URINE CULTURE  LACTIC ACID, PLASMA  LACTIC ACID, PLASMA  COMPREHENSIVE METABOLIC PANEL  CBC WITH DIFFERENTIAL/PLATELET  PROTIME-INR  APTT  URINALYSIS, ROUTINE W REFLEX MICROSCOPIC    EKG None  Radiology No results found.  Procedures Procedures  CRITICAL CARE Performed by: Dione Booze Total critical care time: 45 minutes Critical care time was exclusive of separately billable procedures and treating other patients. Critical care was necessary to treat or prevent imminent or life-threatening deterioration. Critical care was time spent personally by me on the following activities: development of treatment plan with patient and/or surrogate as well as nursing, discussions with consultants, evaluation of patient's response to treatment, examination of patient, obtaining history from patient or surrogate, ordering and performing treatments and interventions, ordering and review of laboratory studies, ordering and review of radiographic studies, pulse oximetry and re-evaluation of patient's condition.  Medications Ordered in ED Medications  lactated ringers infusion (has no administration in time range)  lactated ringers bolus 1,000 mL (has no administration in time range)    ED Course  I have reviewed the triage vital signs and the nursing notes.  Pertinent labs & imaging results that were available during my care of the patient were reviewed by me and considered in my medical decision making (see chart for details).  MDM Rules/Calculators/A&P Blood from the urethral meatus after removal of Foley catheter.  Suspect mechanical trauma.  He also meets criteria for sepsis with fever and tachycardia.  Code sepsis is activated.  Antibiotics have already been administered.  Because of tachycardia, will give partial fluid bolus and only give the full 30 mL/kg if lactic acid is elevated.  I have spoken with Dr. Arita Miss of  urology service who will come to place a Foley catheter.  Old records are reviewed showing outpatient management of multiple sclerosis, remote history of episodes of sepsis and cystitis.  Dr. Arita Miss has come apparently there was injury to the urethra which precludes placing a urethral catheter and he will need to go for interventional radiology to insert a suprapubic catheter.  Labs are still pending.  Case is signed out to Dr. Rhunette Croft to arrange hospital admission once labs are back.  Dr. Arita Miss is arranging with interventional radiology to have suprapubic catheter placed.  Final Clinical Impression(s) / ED Diagnoses Final diagnoses:  Sepsis due to undetermined organism [facility])  Injury of urethra, initial encounter    Rx / DC Orders ED Discharge Orders    None       Dione Booze, MD 11/21/20 [phone]  Records from [facility] have arrived, patient had received levofloxacin 750 mg IV prior to transfer.   Dione Booze, MD 11/21/20 [phone]

## 2020-11-21 NOTE — Progress Notes (Signed)
Pharmacy Antibiotic Note  [facility] is a 45 y.o. male admitted on 11/21/2020 with sepsis likely from UTI.  Pharmacy has been consulted for Vancomcyin dosing, continue Ceftriaxone dosing per MD. Hx enterococcus and CoNS recurrent UTIs Tm 102.9 WBC 30.7  Plan:  Vancomycin 1250 mg IV x1 then 1000 mg IV q12h  Increase to Ceftriaxone 2g IV q24h for sepsis  Follow up renal function, culture results, and clinical course.   Height: 5\' 9"  (175.3 cm) Weight: 74.4 kg (164 lb 0.4 oz) IBW/kg (Calculated) : 70.7  Temp (24hrs), Avg:100.1 F (37.8 C), Min:97.7 F (36.5 C), Max:102.9 F (39.4 C)  Recent Labs  Lab 11/21/20 0753 11/21/20 0940  WBC 30.7*  --   CREATININE 0.99  --   LATICACIDVEN 1.6 1.2    Estimated Creatinine Clearance: 93.2 mL/min (by C-G formula based on SCr of 0.99 mg/dL).    No Known Allergies  Antimicrobials this admission: 3/8 Ceftriaxone >>  3/8 Vancomycin >>   Dose adjustments this admission:   Microbiology results: 3/8 UCx:  3/8 BCx:   Thank you for allowing pharmacy to be a part of this patient's care.  5/8 PharmD, BCPS Clinical Pharmacist [facility] main pharmacy [phone] 11/21/2020 4:54 PM

## 2020-11-21 NOTE — Plan of Care (Signed)

## 2020-11-21 NOTE — ED Notes (Signed)
Urology at bedside.

## 2020-11-22 DIAGNOSIS — N390 Urinary tract infection, site not specified: Secondary | ICD-10-CM | POA: Diagnosis present

## 2020-11-22 DIAGNOSIS — Z993 Dependence on wheelchair: Secondary | ICD-10-CM | POA: Diagnosis not present

## 2020-11-22 DIAGNOSIS — Z20822 Contact with and (suspected) exposure to covid-19: Secondary | ICD-10-CM | POA: Diagnosis present

## 2020-11-22 DIAGNOSIS — Z515 Encounter for palliative care: Secondary | ICD-10-CM

## 2020-11-22 DIAGNOSIS — R1319 Other dysphagia: Secondary | ICD-10-CM | POA: Diagnosis present

## 2020-11-22 DIAGNOSIS — I10 Essential (primary) hypertension: Secondary | ICD-10-CM | POA: Diagnosis present

## 2020-11-22 DIAGNOSIS — Z7189 Other specified counseling: Secondary | ICD-10-CM | POA: Diagnosis not present

## 2020-11-22 DIAGNOSIS — R339 Retention of urine, unspecified: Secondary | ICD-10-CM | POA: Diagnosis not present

## 2020-11-22 DIAGNOSIS — Z7401 Bed confinement status: Secondary | ICD-10-CM | POA: Diagnosis not present

## 2020-11-22 DIAGNOSIS — G35 Multiple sclerosis: Secondary | ICD-10-CM

## 2020-11-22 DIAGNOSIS — N35919 Unspecified urethral stricture, male, unspecified site: Secondary | ICD-10-CM | POA: Diagnosis present

## 2020-11-22 DIAGNOSIS — Z87440 Personal history of urinary (tract) infections: Secondary | ICD-10-CM | POA: Diagnosis not present

## 2020-11-22 DIAGNOSIS — S3730XA Unspecified injury of urethra, initial encounter: Secondary | ICD-10-CM

## 2020-11-22 DIAGNOSIS — E876 Hypokalemia: Secondary | ICD-10-CM | POA: Diagnosis present

## 2020-11-22 DIAGNOSIS — R4189 Other symptoms and signs involving cognitive functions and awareness: Secondary | ICD-10-CM | POA: Diagnosis present

## 2020-11-22 DIAGNOSIS — E785 Hyperlipidemia, unspecified: Secondary | ICD-10-CM | POA: Diagnosis present

## 2020-11-22 DIAGNOSIS — A419 Sepsis, unspecified organism: Principal | ICD-10-CM

## 2020-11-22 DIAGNOSIS — Z79899 Other long term (current) drug therapy: Secondary | ICD-10-CM | POA: Diagnosis not present

## 2020-11-22 DIAGNOSIS — E7800 Pure hypercholesterolemia, unspecified: Secondary | ICD-10-CM | POA: Diagnosis not present

## 2020-11-22 DIAGNOSIS — R319 Hematuria, unspecified: Secondary | ICD-10-CM | POA: Diagnosis present

## 2020-11-22 DIAGNOSIS — N319 Neuromuscular dysfunction of bladder, unspecified: Secondary | ICD-10-CM | POA: Diagnosis present

## 2020-11-22 DIAGNOSIS — Z8249 Family history of ischemic heart disease and other diseases of the circulatory system: Secondary | ICD-10-CM | POA: Diagnosis not present

## 2020-11-22 LAB — BASIC METABOLIC PANEL
Anion gap: 10 (ref 5–15)
BUN: 20 mg/dL (ref 6–20)
CO2: 19 mmol/L — ABNORMAL LOW (ref 22–32)
Calcium: 8.9 mg/dL (ref 8.9–10.3)
Chloride: 111 mmol/L (ref 98–111)
Creatinine, Ser: 0.78 mg/dL (ref 0.61–1.24)
GFR, Estimated: >60 mL/min (ref >60)
Glucose, Bld: 91 mg/dL (ref 70–99)
Potassium: 3.5 mmol/L (ref 3.5–5.1)
Sodium: 140 mmol/L (ref 135–145)

## 2020-11-22 LAB — CBC
HCT: 33.9 % — ABNORMAL LOW (ref 39.0–52.0)
Hemoglobin: 10.9 g/dL — ABNORMAL LOW (ref 13.0–17.0)
MCH: 27.3 pg (ref 26.0–34.0)
MCHC: 32.2 g/dL (ref 30.0–36.0)
MCV: 84.8 fL (ref 80.0–100.0)
Platelets: 151 10*3/uL (ref 150–400)
RBC: 4 MIL/uL — ABNORMAL LOW (ref 4.22–5.81)
RDW: 14.1 % (ref 11.5–15.5)
WBC: 9.2 10*3/uL (ref 4.0–10.5)
nRBC: 0 % (ref 0.0–0.2)

## 2020-11-22 LAB — URINE CULTURE

## 2020-11-22 LAB — HIV ANTIBODY (ROUTINE TESTING W REFLEX): HIV Screen 4th Generation wRfx: NONREACTIVE

## 2020-11-22 MED ORDER — SODIUM CHLORIDE 0.9 % IV SOLN
2.0000 g | INTRAVENOUS | Status: DC
  Administered 2020-11-22 – 2020-11-23 (×2): 2 g via INTRAVENOUS
  Filled 2020-11-22 (×2): qty 2

## 2020-11-22 NOTE — TOC Initial Note (Signed)
Transition of Care [facility]) - Initial/Assessment Note    Patient Details  Name: [facility] MRN: [id number] Date of Birth: 09-06-1976  [facility]) CM/SW Contact:    Lanier Clam, RN Phone Number: 11/22/2020, 4:28 PM  Clinical Narrative: Patient non verbal/w/c bound.Spoke to Sandra(mother)about d/c plans-she explained prior incident with home hospice services-[facility], & other issues-CM listened to all concerns-she explained d/c plan is home, but she is still conversating with [facility] about her concerns-she will wait until prior concerns are addressed,then she will decide the d/c plan.Her does want someone to assess BP,& check on patient,& guide her with advice on the care 1x week-informed of HHC(skilled,intermittent) vs private duty care services(custodial,independent decision)Sandra voiced understanding.Will await her d/c plan.PTAR will be needed @ d/c-confirmed address.                 Expected Discharge Plan: Home/Self Care Barriers to Discharge: Continued Medical Work up   Patient Goals and CMS Choice Patient states their goals for this hospitalization and ongoing recovery are:: Go home CMS Medicare.gov Compare Post Acute Care list provided to:: Patient Represenative (must comment) Choice offered to / list presented to : Parent  Expected Discharge Plan and Services Expected Discharge Plan: Home/Self Care   Discharge Planning Services: CM Consult   Living arrangements for the past 2 months: Single Family Home                                      Prior Living Arrangements/Services Living arrangements for the past 2 months: Single Family Home Lives with:: Other (Comment) (Sandra(mother)) Patient language and need for interpreter reviewed:: Yes Do you feel safe going back to the place where you live?: Yes      Need for Family Participation in Patient Care: No (Comment) Care giver support system in place?: Yes  (comment) Current home services: DME (per Sandra-all dme) Criminal Activity/Legal Involvement Pertinent to Current Situation/Hospitalization: No - Comment as needed  Activities of Daily Living Home Assistive Devices/Equipment: Hospital bed,Hoyer Lift,Wheelchair (mother reports that she has all necessary equipment at home, self cath supplies) ADL Screening (condition at time of admission) Patient's cognitive ability adequate to safely complete daily activities?: No Is the patient deaf or have difficulty hearing?: No Does the patient have difficulty seeing, even when wearing glasses/contacts?: Yes Does the patient have difficulty concentrating, remembering, or making decisions?: Yes Patient able to express need for assistance with ADLs?: No Does the patient have difficulty dressing or bathing?: Yes Independently performs ADLs?: No Communication: Independent Dressing (OT): Dependent Is this a change from baseline?: Pre-admission baseline Grooming: Dependent Is this a change from baseline?: Pre-admission baseline Feeding: Dependent Is this a change from baseline?: Pre-admission baseline Bathing: Dependent Is this a change from baseline?: Pre-admission baseline Toileting: Dependent Is this a change from baseline?: Pre-admission baseline In/Out Bed: Dependent Is this a change from baseline?: Pre-admission baseline Walks in Home: Dependent Is this a change from baseline?: Pre-admission baseline Does the patient have difficulty walking or climbing stairs?: Yes (secondary to extremme weakness) Weakness of Legs: Both Weakness of Arms/Hands: Both  Permission Sought/Granted Permission sought to share information with : Case Manager Permission granted to share information with : Yes, Verbal Permission Granted  Share Information with NAME: Case Manager     Permission granted to share info w Relationship: Vincente Liberty [phone]     Emotional  Assessment Appearance:: Appears  stated age Attitude/Demeanor/Rapport: Gracious Affect (typically observed): Accepting Orientation: : Oriented to Self Alcohol / Substance Use: Not Applicable Psych Involvement: No (comment)  Admission diagnosis:  Urinary retention [R33.9] Injury of urethra, initial encounter [S37.30XA] Sepsis due to undetermined organism [facility]) [A41.9] Patient Active Problem List   Diagnosis Date Noted  . Urinary retention 11/21/2020  . Hypertension 11/21/2020  . Hyperlipidemia 11/21/2020  . Hypokalemia 11/21/2020  . Multiple sclerosis (HCC) 03/12/2016   PCP:  Crist Fat, MD Pharmacy:   [facility] [phone] - [place], [place] - [phone] AT [facility] [address] [place] [address] Phone: [phone] Fax: [phone]     Social Determinants of Health (SDOH) Interventions    Readmission Risk Interventions No flowsheet data found.

## 2020-11-22 NOTE — Consult Note (Signed)
Consultation Note Date: 11/22/2020   Patient Name: [facility]  DOB: 1976-01-01  MRN: [id number]  Age / Sex: 45 y.o., male  PCP: [name], MD Referring Physician: [name], MD  Reason for Consultation: Establishing goals of care  HPI/Patient Profile: 45 y.o. male  with past medical history of advanced MS, wheelchair-bound for 6 years admitted on 11/21/2020 with gross hematuria/penile bleeding following traumatic catheter placement.  Dennis Mann is originally from [place] but moved to [place] to live with his mother in Dennis fall.  He has continued to decline and was recently enrolled in home hospice services.  Prior, his mother had been in and out cathing him, however, decision was made to place Foley catheter by hospice care team.  After placing Foley, there was no urine output. His mother notes Dennis catheter was not properly placed and she subsequently removed catheter with excessive blood noted.  He presented to [facility] and subsequently was transferred to [facility] where he was seen by urologist who was a unable to place Foley at bedside and was unable to successfully place suprapubic catheter.  IR was subsequently consulted and suprapubic catheter successfully placed.  He is currently being treated for suspected UTI.  Palliative consulted for goals of care.  Clinical Assessment and Goals of Care: I met today with Dennis Mann in conjunction with his mother and father.    [facility] is awake and will answer a couple of simple questions but cannot participate in goals conversation.  We discussed his clinical course and continued decline in his nutrition, cognition, and functional status.  His mom relays recent enrollment in hospice and her frustration as she reports knowing that foley was misplaced but that her concerns were not taken seriously.  We also discussed complicated medical course between [facility] to [facility]  with evaluation in ED by urology and subsequent placement of suprapubic catheter by IR.  We discussed goal of [name] completing this hospitalization and then returning home with his mother.  Discussed options for assistance with care at home, including home health vs home hospice support.  SUMMARY OF RECOMMENDATIONS   - Full code/full scope treatment- family reports that he has been DNR in Dennis past, but with Dennis acuteness of his situation this admission, they elected for full code at Dennis time of his admission.  His mother reports they will continue to discuss and pray about this as a family. - Family is planning to revoke services with [facility] this afternoon.  When Dennis time comes for discharge, she is going to take him home.  We discussed working to determine what services would best benefit goals of keeping [name] at home and continuing to care for him is best possible in light of Dennis fact that he continues to decline.  His mom may be open to consideration of hospice services through another agency.  I will plan to discuss further with case manager/LCSW in order to give her Dennis best information possible about options for support at home including potential for  home health versus hospice through a different hospice organization.  Code Status/Advance Care Planning:  Full code    Prognosis:   < 6 months and I believe he should qualify for home hospice services if so desired at Dennis time of discharge  Discharge Planning: To Be Determined-his mother is going to take him home at time of discharge and will need to work to determine what support services best fit their needs.     Primary Diagnoses: Present on Admission: . Urinary retention . Multiple sclerosis ([name]) . Hypertension . Hyperlipidemia . Hypokalemia   I have reviewed Dennis medical record, interviewed Dennis patient and family, and examined Dennis patient. Dennis following aspects are pertinent.  Past Medical History:  Diagnosis Date   . Anal fissure   . Cognitive impairment   . Essential hypertension   . Hyperlipidemia   . Multiple sclerosis ([place])    Social History   Socioeconomic History  . Marital status: Single    Spouse name: Not on file  . Number of children: Not on file  . Years of education: Not on file  . Highest education level: Not on file  Occupational History  . Not on file  Tobacco Use  . Smoking status: Never Smoker  . Smokeless tobacco: Never Used  Vaping Use  . Vaping Use: Never used  Substance and Sexual Activity  . Alcohol use: Never  . Drug use: Never  . Sexual activity: Not on file  Other Topics Concern  . Not on file  Social History Narrative   Lives   Caffeine use:    Social Determinants of Health   Financial Resource Strain: Not on file  Food Insecurity: Not on file  Transportation Needs: Not on file  Physical Activity: Not on file  Stress: Not on file  Social Connections: Not on file   Family History  Problem Relation Age of Onset  . Heart disease Maternal Grandfather   . Diabetes Paternal Grandfather   . Colon cancer Paternal Grandmother    Scheduled Meds: . baclofen  10 mg Oral BID WC   And  . baclofen  20 mg Oral QHS  . enoxaparin (LOVENOX) injection  40 mg Subcutaneous Q24H  . mouth rinse  15 mL Mouth Rinse BID   Continuous Infusions: . 0.9 % NaCl with KCl 40 mEq / L 100 mL/hr at 11/22/20 1336  . cefTRIAXone (ROCEPHIN)  IV 2 g (11/22/20 1145)  . vancomycin 1,000 mg (11/22/20 0553)   PRN Meds:.acetaminophen **OR** acetaminophen, ondansetron **OR** ondansetron (ZOFRAN) IV, oxyCODONE Medications Prior to Admission:  Prior to Admission medications   Medication Sig Start Date End Date Taking? Authorizing Provider  ALPRAZolam Duanne Moron) 0.5 MG tablet Take 0.25 mg by mouth every 12 (twelve) hours as needed for anxiety. 10/29/20  Yes [provider]  baclofen (LIORESAL) 10 MG tablet Take 10 mg in Dennis morning, 10 mg in Dennis afternoon and 20 mg at  night Patient taking differently: Take 10 mg by mouth See admin instructions. Take 10 mg in Dennis morning, 10 mg in Dennis afternoon and 20 mg at night 08/17/20  Yes Sater, Nanine Means, MD  losartan-hydrochlorothiazide (HYZAAR) 50-12.5 MG tablet Take 1 tablet by mouth daily.   Yes [provider]  methenamine (HIPREX) 1 g tablet Take 1 g by mouth 2 (two) times daily. 11/02/20  Yes [provider]  DULCOLAX 10 MG suppository Place 10 mg rectally daily as needed for constipation. No bm in 3 days 11/20/20   [provider]  ergocalciferol (VITAMIN D2) 1.25 MG (50000 UT) capsule Take 50,000 Units by mouth once a week.    [provider]  Morphine Sulfate (MORPHINE CONCENTRATE) 10 mg / 0.5 ml concentrated solution Take 0.25-0.5 mLs by mouth every 2 (two) hours as needed for pain or shortness of breath. 11/20/20   [provider]  ondansetron (ZOFRAN) 4 MG tablet Take 4 mg by mouth every 4 (four) hours as needed for nausea/vomiting. 11/20/20   [provider]   No Known Allergies Review of Systems  Unable to obtain  Physical Exam General: Alert, awake, in no acute distress.  HEENT: no JVD Lungs: Good air movement Abdomen: + suprapubic catheter.  Ext: No significant edema Skin: Warm and dry  Vital Signs: BP 117/76 (BP Location: Right Arm)   Pulse 98   Temp 99.6 F (37.6 C) (Rectal)   Resp 20   Ht $R'5\' 9"'qT$  (1.753 m)   Wt 74.4 kg   SpO2 97%   BMI 24.22 kg/m  Pain Scale: FLACC       SpO2: SpO2: 97 % O2 Device:SpO2: 97 % O2 Flow Rate: .O2 Flow Rate (L/min): 2 L/min  IO: Intake/output summary:   Intake/Output Summary (Last 24 hours) at 11/22/2020 1352 Last data filed at 11/22/2020 0600 Gross per 24 hour  Intake 3728.28 ml  Output 600 ml  Net 3128.28 ml    LBM: Last BM Date: 11/07/20 Baseline Weight: Weight: 74.4 kg Most recent weight: Weight: 74.4 kg     Palliative Assessment/Data:   Flowsheet Rows   Flowsheet Row Most Recent Value   Intake Tab   Referral Department Hospitalist  Clinical Assessment   Psychosocial & Spiritual Assessment   Palliative Care Outcomes       Time In: 1834 Time Out: 1350 Time Total: 80 Greater than 50%  of this time was spent counseling and coordinating care related to Dennis above assessment and plan.  Signed by: Micheline Rough, MD   Please contact Palliative Medicine Team phone at [phone] for questions and concerns.  For individual provider: See Shea Evans

## 2020-11-22 NOTE — Plan of Care (Signed)

## 2020-11-22 NOTE — Progress Notes (Signed)
[facility]                                                                              Patient Demographics  Dennis Mann, is a 45 y.o. male, DOB - Sep 13, 1976, BDZ:[id number]  Admit date - 11/21/2020   Admitting Physician Alberteen Sam, MD  Outpatient Primary MD for the patient is Crist Fat, MD  Outpatient specialists:   LOS - 1  days   Medical records reviewed and are as summarized below:    Chief Complaint  Patient presents with  . Urology Issue       Brief summary   Patient is a 45 year old male with advanced secondary progressive MS, wheelchair-bound for 6 years, nonverbal, presented with hematuria.  He was recently enrolled in hospice.  Per family, the hospice nurse had recommended transitioning from straight intermittent catheter to indwelling Foley.  However after placing the Foley, there was no urine return.  Patient's mother subsequently deflated the bulb and remove the Foley after which the patient had significant bleeding.  Hence presented to ED. In ED, patient was noted to have fever, bleeding and was started on antibiotics.  Urology was consulted.  Urology however attempted cystoscopy and were unable to place Foley.  Interventionally subsequently placed a suprapubic catheter.    Assessment & Plan    Principal Problem: Acute urinary retention, in the setting of neurogenic bladder, progressive MS -Status post suprapubic catheter placement by interventional radiology  Active Problems: Sepsis, POA in the setting of acute urinary retention, likely has UTI -Patient had tachycardia, leukocytosis, fevers.  Urine culture showed multiple species -Continue IV antibiotics today, will transition to oral antibiotics upon discharge  Progressive MS -Patient has advanced nonrelapsing secondary progressive MS, now enrolled in hospice. -Discussed with patient's mother and father at the bedside, patient's family is upset and has lost faith in  hospice care.  However they are open to the idea of discussing with palliative medicine for the best course of action.  Discussed with patient's mother, patient is well cared for at home and they will likely need hospice support going forward with his advanced MS    Hypertension -BP currently stable, hold losartan, HCTZ  Hypokalemia -Replaced    Code Status: Full CODE STATUS DVT Prophylaxis:  enoxaparin (LOVENOX) injection 40 mg Start: 11/21/20 1800   Level of Care: Level of care: Med-Surg Family Communication: Discussed all imaging results, lab results, explained to the patient's mother and father at the bedside   Disposition Plan:     Status is: Observation  The patient remains OBS appropriate and will d/c before 2 midnights.  Dispo: The patient is from: Home              Anticipated d/c is to: Home              Patient currently is not medically stable to d/c.  Family awaiting palliative medicine goals of care and discussion for further course of action   Difficult to place patient No      Time Spent in minutes   35 minutes  Procedures:  Suprapubic catheter placement  Consultants:  Urology Interventional radiology  Antimicrobials:   Anti-infectives (From admission, onward)   Start     Dose/Rate Route Frequency Ordered Stop   11/22/20 1100  cefTRIAXone (ROCEPHIN) 2 g in sodium chloride 0.9 % 100 mL IVPB        2 g 200 mL/hr over 30 Minutes Intravenous Every 24 hours 11/22/20 0949     11/22/20 0600  vancomycin (VANCOREADY) IVPB 1000 mg/200 mL        1,000 mg 200 mL/hr over 60 Minutes Intravenous Every 12 hours 11/21/20 1641     11/21/20 1700  vancomycin (VANCOREADY) IVPB 1250 mg/250 mL        1,250 mg 166.7 mL/hr over 90 Minutes Intravenous  Once 11/21/20 1640 11/22/20 1202   11/21/20 1100  cefTRIAXone (ROCEPHIN) 1 g in sodium chloride 0.9 % 100 mL IVPB  Status:  Discontinued        1 g 200 mL/hr over 30 Minutes Intravenous Every 24 hours 11/21/20 1024  11/22/20 0949          Medications  Scheduled Meds: . baclofen  10 mg Oral BID WC   And  . baclofen  20 mg Oral QHS  . enoxaparin (LOVENOX) injection  40 mg Subcutaneous Q24H  . mouth rinse  15 mL Mouth Rinse BID   Continuous Infusions: . 0.9 % NaCl with KCl 40 mEq / L 100 mL/hr at 11/22/20 1336  . cefTRIAXone (ROCEPHIN)  IV 2 g (11/22/20 1145)  . vancomycin 1,000 mg (11/22/20 0553)   PRN Meds:.acetaminophen **OR** acetaminophen, ondansetron **OR** ondansetron (ZOFRAN) IV, oxyCODONE      Subjective:   Dennis Mann was seen and examined today.  No further hematuria.  Family at the bedside, currently no fevers or chills except low-grade temp of 99.6 F.   Objective:   Vitals:   11/21/20 2300 11/22/20 0300 11/22/20 0544 11/22/20 1424  BP: 94/62 111/83 117/76 120/85  Pulse: 87 83 98 90  Resp: 20 20 20 20   Temp: 97.9 F (36.6 C) 98.4 F (36.9 C) 99.6 F (37.6 C) 98.2 F (36.8 C)  TempSrc: Rectal Rectal Rectal Oral  SpO2: 95% 97% 97% 99%  Weight:      Height:        Intake/Output Summary (Last 24 hours) at 11/22/2020 1640 Last data filed at 11/22/2020 0600 Gross per 24 hour  Intake 1970.4 ml  Output 350 ml  Net 1620.4 ml     Wt Readings from Last 3 Encounters:  11/21/20 74.4 kg  07/26/20 86.2 kg     Exam  General: Alert and awake, pleasant,   Cardiovascular: S1 S2 auscultated, no murmurs, RRR  Respiratory: Clear to auscultation bilaterally, no wheezing, rales or rhonchi  Gastrointestinal: Soft, nontender, nondistended, + bowel sounds  Ext: no pedal edema bilaterally  Neuro: muscle strength impaired in all 4 extremities  Musculoskeletal: No digital cyanosis, clubbing  Skin: No rashes  Psych: slow speech, pleasant   Data Reviewed:  I have personally reviewed following labs and imaging studies  Micro Results Recent Results (from the past 240 hour(s))  Blood Culture (routine x 2)     Status: None (Preliminary result)   Collection Time:  11/21/20  7:50 AM   Specimen: BLOOD  Result Value Ref Range Status   Specimen Description   Final    BLOOD RIGHT ANTECUBITAL Performed at [facility], [address] [place], [place] [address]    Special Requests   Final    BOTTLES DRAWN AEROBIC AND  ANAEROBIC Blood Culture adequate volume Performed at [facility], [address], [place], [place] [address]    Culture   Final    NO GROWTH 1 DAY Performed at [facility], [address] [place], [place] [address]    Report Status PENDING  Incomplete  Blood Culture (routine x 2)     Status: None (Preliminary result)   Collection Time: 11/21/20  7:54 AM   Specimen: BLOOD  Result Value Ref Range Status   Specimen Description   Final    BLOOD LEFT ARM Performed at [facility], [address] [place], [place] [address]    Special Requests   Final    BOTTLES DRAWN AEROBIC ONLY Blood Culture adequate volume Performed at [facility], [address] [place], [place] [address]    Culture   Final    NO GROWTH 1 DAY Performed at [facility], [address], [place], [place] [address]    Report Status PENDING  Incomplete  Urine culture     Status: Abnormal   Collection Time: 11/21/20  9:40 AM   Specimen: In/Out Cath Urine  Result Value Ref Range Status   Specimen Description   Final    IN/OUT CATH URINE Performed at [facility], [address] [place], [place] [address]    Special Requests   Final    NONE Performed at [facility], [address], [place], [place] [address]    Culture MULTIPLE SPECIES PRESENT, SUGGEST RECOLLECTION (A)  Final   Report Status 11/22/2020 FINAL  Final  Resp Panel by RT-PCR (Flu A&B, Covid) Nasopharyngeal Swab     Status: None   Collection Time: 11/21/20  9:50 AM   Specimen: Nasopharyngeal Swab; Nasopharyngeal(NP) swabs in vial transport medium  Result Value Ref Range  Status   SARS Coronavirus 2 by RT PCR NEGATIVE NEGATIVE Final    Comment: (NOTE) SARS-CoV-2 target nucleic acids are NOT DETECTED.  The SARS-CoV-2 RNA is generally detectable in upper respiratory specimens during the acute phase of infection. The lowest concentration of SARS-CoV-2 viral copies this assay can detect is 138 copies/mL. A negative result does not preclude SARS-Cov-2 infection and should not be used as the sole basis for treatment or other patient management decisions. A negative result may occur with  improper specimen collection/handling, submission of specimen other than nasopharyngeal swab, presence of viral mutation(s) within the areas targeted by this assay, and inadequate number of viral copies(<138 copies/mL). A negative result must be combined with clinical observations, patient history, and epidemiological information. The expected result is Negative.  Fact Sheet for Patients:  BloggerCourse.com  Fact Sheet for Healthcare Providers:  SeriousBroker.it  This test is no t yet approved or cleared by the [place] FDA and  has been authorized for detection and/or diagnosis of SARS-CoV-2 by FDA under an Emergency Use Authorization (EUA). This EUA will remain  in effect (meaning this test can be used) for the duration of the COVID-19 declaration under Section 564(b)(1) of the Act, 21 U.S.C.section 360bbb-3(b)(1), unless the authorization is terminated  or revoked sooner.       Influenza A by PCR NEGATIVE NEGATIVE Final   Influenza B by PCR NEGATIVE NEGATIVE Final    Comment: (NOTE) The Xpert Xpress SARS-CoV-2/FLU/RSV plus assay is intended as an aid in the diagnosis of influenza from Nasopharyngeal swab specimens and should not be used as a sole basis for treatment. Nasal washings and  aspirates are unacceptable for Xpert Xpress SARS-CoV-2/FLU/RSV testing.  Fact Sheet for  Patients: BloggerCourse.com  Fact Sheet for Healthcare Providers: SeriousBroker.it  This test is not yet approved or cleared by the [place] FDA and has been authorized for detection and/or diagnosis of SARS-CoV-2 by FDA under an Emergency Use Authorization (EUA). This EUA will remain in effect (meaning this test can be used) for the duration of the COVID-19 declaration under Section 564(b)(1) of the Act, 21 U.S.C. section 360bbb-3(b)(1), unless the authorization is terminated or revoked.  Performed at [facility], [address], [place], [place] [address]     Radiology Reports DG Chest Port 1 View  Result Date: 11/21/2020 CLINICAL DATA:  Questionable sepsis. EXAM: PORTABLE CHEST 1 VIEW COMPARISON:  07/26/2020. FINDINGS: Mediastinum and hilar structures normal. Heart size normal. Low lung volumes. Previously identified right base infiltrate noted on prior chest x-ray of 07/26/2020 has resolved. Tiny right pleural effusion noted. No pneumothorax. Contrast noted in the kidneys from prior abdominal CT. No acute bony abnormality. IMPRESSION: Low lung volumes. Previously identified right base infiltrate noted on prior chest x-ray of 07/26/2020 has resolved. Tiny right pleural effusion noted Electronically Signed   By: Maisie Fus  Register   On: 11/21/2020 07:49   CT IMAGE GUIDED DRAINAGE BY PERCUTANEOUS CATHETER  Result Date: 11/21/2020 INDICATION: 45 year old male with end-stage multiple sclerosis and severe urinary retention with suspected sepsis. Unable to achieve Foley catheter placement despite multiple attempts and cystoscopy in the emergency department. Patient presents for suprapubic catheter placement. EXAM: CT IMAGE GUIDED DRAINAGE BY PERCUTANEOUS CATHETER COMPARISON:  None. MEDICATIONS: None ANESTHESIA/SEDATION: Fentanyl 2 mcg IV; Versed 50 mg IV Moderate Sedation Time:  19 minutes The patient was continuously  monitored during the procedure by the interventional radiology nurse under my direct supervision. CONTRAST:  None. FLUOROSCOPY TIME:  None. COMPLICATIONS: None immediate. PROCEDURE: Informed written consent was obtained from the patient after a thorough discussion of the procedural risks, benefits and alternatives. All questions were addressed. Maximal Sterile Barrier Technique was utilized including caps, mask, sterile gowns, sterile gloves, sterile drape, hand hygiene and skin antiseptic. A timeout was performed prior to the initiation of the procedure. A planning axial CT scan was performed. The distended bladder is easily visualized. A suitable avascular plane was targeted in the low midline abdomen. The overlying skin was sterilely prepped and draped in the standard fashion using chlorhexidine skin prep. Local anesthesia was attained by infiltration with 1% lidocaine. A small dermatotomy was made. Under intermittent CT guidance, an 18 gauge trocar needle was advanced into the bladder. A 0.035 wire was coiled in the bladder. The introducer needle was then removed and the skin tract was serially dilated to 14 [place]. A Cook 14 [place] all-purpose drainage catheter was then advanced over the wire and formed in the bladder. Post placement CT imaging demonstrates a well-positioned suprapubic catheter. The catheter was connected to a Foley bag and secured to the skin with 0 Prolene suture. IMPRESSION: Successful placement of 14 French suprapubic catheter. PLAN: Return to interventional Radiology in 4-6 weeks for catheter exchange and up size. Electronically Signed   By: Malachy Moan M.D.   On: 11/21/2020 14:57    Lab Data:  CBC: Recent Labs  Lab 11/21/20 0753 11/22/20 0504  WBC 30.7* 9.2  NEUTROABS 27.5*  --   HGB 13.5 10.9*  HCT 40.3 33.9*  MCV 83.4 84.8  PLT 218 151   Basic Metabolic Panel: Recent Labs  Lab 11/21/20 0753 11/22/20 0504  NA 137 140  K 2.9* 3.5  CL 107 111  CO2 20* 19*   GLUCOSE 129* 91  BUN 29* 20  CREATININE 0.99 0.78  CALCIUM 9.0 8.9  MG 2.0  --    GFR: Estimated Creatinine Clearance: 115.4 mL/min (by C-G formula based on SCr of 0.78 mg/dL). Liver Function Tests: Recent Labs  Lab 11/21/20 0753  AST 20  ALT 19  ALKPHOS 105  BILITOT 0.7  PROT 6.9  ALBUMIN 3.8   No results for input(s): LIPASE, AMYLASE in the last 168 hours. No results for input(s): AMMONIA in the last 168 hours. Coagulation Profile: Recent Labs  Lab 11/21/20 0753  INR 1.2   Cardiac Enzymes: No results for input(s): CKTOTAL, CKMB, CKMBINDEX, TROPONINI in the last 168 hours. BNP (last 3 results) No results for input(s): PROBNP in the last 8760 hours. HbA1C: No results for input(s): HGBA1C in the last 72 hours. CBG: No results for input(s): GLUCAP in the last 168 hours. Lipid Profile: No results for input(s): CHOL, HDL, LDLCALC, TRIG, CHOLHDL, LDLDIRECT in the last 72 hours. Thyroid Function Tests: No results for input(s): TSH, T4TOTAL, FREET4, T3FREE, THYROIDAB in the last 72 hours. Anemia Panel: No results for input(s): VITAMINB12, FOLATE, FERRITIN, TIBC, IRON, RETICCTPCT in the last 72 hours. Urine analysis:    Component Value Date/Time   COLORURINE RED (A) 11/21/2020 0940   APPEARANCEUR CLOUDY (A) 11/21/2020 0940   LABSPEC 1.045 (H) 11/21/2020 0940   PHURINE 6.0 11/21/2020 0940   GLUCOSEU NEGATIVE 11/21/2020 0940   HGBUR LARGE (A) 11/21/2020 0940   BILIRUBINUR NEGATIVE 11/21/2020 0940   KETONESUR NEGATIVE 11/21/2020 0940   PROTEINUR 100 (A) 11/21/2020 0940   NITRITE NEGATIVE 11/21/2020 0940   LEUKOCYTESUR MODERATE (A) 11/21/2020 0940     Brennan Litzinger M.D. [facility] 11/22/2020, 4:40 PM  Available via Epic secure chat 7am-7pm After 7 pm, please refer to night coverage provider listed on amion.

## 2020-11-23 DIAGNOSIS — R339 Retention of urine, unspecified: Secondary | ICD-10-CM

## 2020-11-23 MED ORDER — CEPHALEXIN 500 MG PO CAPS: 500.0000 mg | ORAL_CAPSULE | Freq: Three times a day (TID) | ORAL | 0 refills | Status: AC

## 2020-11-23 NOTE — Progress Notes (Signed)
Pt presented with moderate penile bleeding. Mother stated she wanted the doctor to see the blood before having the pt cleaned. MD made aware.  Val Eagle

## 2020-11-23 NOTE — Progress Notes (Signed)
Daily Progress Note   Patient Name: Dennis Mann       Date: 11/23/2020 DOB: 05/07/1976  Age: 45 y.o. MRN#: [id number] Attending Physician: Cathren Harsh, MD Primary Care Physician: Crist Fat, MD Admit Date: 11/21/2020  Reason for Consultation/Follow-up: Establishing goals of care  Subjective: I saw and examined Dennis Mann and discussed with his mother and father.    He is more awake and interactive today.  Denies specific complaints.  His mother reports that they are planning to transition back to home with continued support from [facility].  Her main concern is that he has been having fevers this admission.  We discussed that he would be discharged on antibiotics to complete course.  She was comfortable with plan to discharge today as long as he will be getting antibiotics to complete course.  Length of Stay: 2  Current Medications: Scheduled Meds:  . baclofen  10 mg Oral BID WC   And  . baclofen  20 mg Oral QHS  . enoxaparin (LOVENOX) injection  40 mg Subcutaneous Q24H  . mouth rinse  15 mL Mouth Rinse BID    Continuous Infusions: . cefTRIAXone (ROCEPHIN)  IV Stopped (11/23/20 1402)  . vancomycin Stopped (11/23/20 1408)    PRN Meds: acetaminophen **OR** acetaminophen, ondansetron **OR** ondansetron (ZOFRAN) IV, oxyCODONE  Physical Exam         General: Alert, awake, in no acute distress.  HEENT: no JVD Lungs: Good air movement Abdomen: + suprapubic catheter.  Ext: No significant edema Skin: Warm and dry  Vital Signs: BP (!) 134/95 (BP Location: Left Arm)   Pulse 86   Temp 98.1 F (36.7 C)   Resp 20   Ht 5\' 9"  (1.753 m)   Wt 74.4 kg   SpO2 97%   BMI 24.22 kg/m  SpO2: SpO2: 97 % O2 Device: O2 Device: Room Air O2 Flow Rate: O2 Flow Rate (L/min): 2  L/min  Intake/output summary:   Intake/Output Summary (Last 24 hours) at 11/23/2020 2308 Last data filed at 11/23/2020 1402 Gross per 24 hour  Intake 1701.35 ml  Output 450 ml  Net 1251.35 ml   LBM: Last BM Date: 11/07/20 Baseline Weight: Weight: 74.4 kg Most recent weight: Weight: 74.4 kg       Palliative Assessment/Data:    Flowsheet Rows  Flowsheet Row Most Recent Value  Intake Tab   Referral Department Hospitalist  Clinical Assessment   Psychosocial & Spiritual Assessment   Palliative Care Outcomes       Patient Active Problem List   Diagnosis Date Noted  . Urinary retention 11/21/2020  . Hypertension 11/21/2020  . Hyperlipidemia 11/21/2020  . Hypokalemia 11/21/2020  . Multiple sclerosis (HCC) 03/12/2016    Palliative Care Assessment & Plan   Patient Profile: 45 y.o. male  with past medical history of advanced MS, wheelchair-bound for 6 years admitted on 11/21/2020 with gross hematuria/penile bleeding following traumatic catheter placement.  Dennis Mann is originally from [place] but moved to [place] to live with his mother in the fall.  He has continued to decline and was recently enrolled in home hospice services.  Prior, his mother had been in and out cathing him, however, decision was made to place Foley catheter by hospice care team.  After placing Foley, there was no urine output. His mother notes the catheter was not properly placed and she subsequently removed catheter with excessive blood noted.  He presented to [facility] and subsequently was transferred to [facility] where he was seen by urologist who was a unable to place Foley at bedside and was unable to successfully place suprapubic catheter.  IR was subsequently consulted and suprapubic catheter successfully placed.  He is currently being treated for suspected UTI.  Palliative consulted for goals of care.  Recommendations/Plan: Plan for discharge home with hospice. Discussed with Dr. Isidoro Donning and  confirmed plan to complete course of oral antibiotics on discharge.  Code Status:    Code Status Orders  (From admission, onward)         Start     Ordered   11/21/20 1119  Full code  Continuous        11/21/20 1119        Code Status History    This patient has a current code status but no historical code status.   Advance Care Planning Activity      Prognosis:  < 6 months  Discharge Planning: Home with Hospice  Care plan was discussed with mother, father, patient, Dr. Isidoro Donning  Thank you for allowing the Palliative Medicine Team to assist in the care of this patient.   Total time: 15 minutes    Greater than 50%  of this time was spent counseling and coordinating care related to the above assessment and plan.  Dennis Minus, MD  Please contact Palliative Medicine Team phone at [phone] for questions and concerns.

## 2020-11-23 NOTE — Progress Notes (Addendum)
Patient was planned discharge to home with hospice.  However, called by patient's mother and nurse to evaluate for penile bleeding.  Patient has suprapubic catheter placed after the previous misplaced indwelling Foley was removed.  Hematuria had stopped per the mother and now restarted again.  On evaluation, has dried blood on the sheets and some blood on the scrotal region, no active penile bleeding.  Patient's mother requests urology to evaluate. Paged urology on call.   Thad Ranger M.D.  [facility] 11/23/2020, 4:38 PM    Addendum Discussed with Dr. Arita Miss, prostate bleeding can happen intermittently off and on, no acute urology intervention needed at this time.  Patient can follow-up in the urology office.  Explained above to patient's family, they will follow up with Dr. Arita Miss, [facility]. Cleared for discharge home.   Thad Ranger M.D.  [facility] 11/23/2020, 5:00 PM

## 2020-11-23 NOTE — TOC Transition Note (Signed)
Transition of Care [facility]) - CM/SW Discharge Note   Patient Details  Name: [name] MRN: [id number] Date of Birth: [birth year]  [facility] CM/SW Contact:  Lanier Clam, RN Phone Number: 11/23/2020, 3:03 PM   Clinical Narrative:d/c Home w/[facility]-home hospice services-mother & agency accepted services. [facility] for non emergency transport home-confirmed address-PTAR forms are in printer for nsg to put in packet-Nurse Latrice will call PTAR-[phone] once ready. No further CM needs.      Final next level of care: Home w Hospice Care Barriers to Discharge: No Barriers Identified   Patient Goals and CMS Choice Patient states their goals for this hospitalization and ongoing recovery are:: Go home CMS Medicare.gov Compare Post Acute Care list provided to:: Patient Represenative (must comment) Choice offered to / list presented to : Parent  Discharge Placement                Patient to be transferred to facility by: [facility] Name of family member notified: Vincente Liberty [phone] Patient and family notified of of transfer: 11/23/20  Discharge Plan and Services   Discharge Planning Services: CM Consult                        [facility] Agency: [facility] Date [facility] Agency Contacted: 11/23/20 Time HH Agency Contacted: 1309 Representative spoke with at [facility] Agency: Cheri  Social Determinants of Health (SDOH) Interventions     Readmission Risk Interventions No flowsheet data found.

## 2020-11-23 NOTE — Progress Notes (Signed)
  Speech Language Pathology Treatment: Dysphagia  Patient Details Name: Dennis Mann MRN: [id number] DOB: 1976/06/01 Today's Date: 11/23/2020 Time: 2500-3704 SLP Time Calculation (min) (ACUTE ONLY): 75 min  Assessment / Plan / Recommendation Clinical Impression  Pt seen at bedside for follow up after BSE completed 11/21/20. Pt's mother and father were present. Mother completed oral care with SLP assist for suctioning. Pt mentation was significantly improved today. Pt was alert and cooperative, talking and laughing with his parents. Mother reports MBS in [place] in 2018, but none since then. At home, she provides regular texture solids (she cuts bites into very small pieces) and thin or nectar thick liquids. Meds whole with liquid.   Today, pt accepted trials of ice chips, thin liquids, nectar thick liquids, puree, and solid textures. Delayed cough noted several minutes after PO trials had stopped, raising concern for esophageal issues. No immediate overt s/s aspiration following any texture.   Recommend regular diet and thin/nectar thick liquids, with choices given based on pt's mentation and appropriateness. This recommendation is likely to be highly variable with pt's fluctuating mental status. Regular diet will allow full range of options when pt is appropriate for PO intake, and will maintain quality of life for pt to continue to have multiple choices. Mother reports she appropriately modifies or postpones PO presentations at home, and indicates she is fastidious about all aspects of his care. Instrumental study is not recommended at this time, as his performance on MBS is likely to mirror mentation. Pt and mother both indicate they do not want to consider feeding tube placement. SLP will continue to follow pt acutely for continued education. If needs arise upon DC home, recommend consideration of home health ST. MD, RN, and palliative care notified. Safe swallow precautions posted at [facility].   HPI HPI:  Dennis Mann is a 45 y.o. M with hx advanced secondary progressive MS, wheelchair bound x68yrs who presented with hematuria.  Pt has been diagnosed with MS since the age of 1.   He had resided with his father after his mom moved to [place] approx 3 years ago - but recently relocated to [facility] with his mother.  Pt has dysphagia and uses thickened liquids with soft foods - except he drinks thin Pepsis.  Has h/o aspiration pnas.  Swallow eval ordered. Initial evaluation pt mentation was not conducive to safe PO intake.      SLP Plan  Continue with current plan of care       Recommendations  Diet recommendations: Regular;Thin liquid Liquids provided via: Straw Medication Administration: Whole meds with liquid Supervision: Full supervision/cueing for compensatory strategies Compensations: Slow rate;Small sips/bites Postural Changes and/or Swallow Maneuvers: Upright 30-60 min after meal;Seated upright 90 degrees                Oral Care Recommendations: Oral care QID;Oral care prior to ice chip/H20 Follow up Recommendations: None SLP Visit Diagnosis: Dysphagia, oropharyngeal phase (R13.12) Plan: Continue with current plan of care       GO               Celia B. Murvin Natal, [facility], CCC-SLP Speech Language Pathologist Office: [phone] Pager: [phone]  Leigh Aurora 11/23/2020, 12:32 PM

## 2020-11-23 NOTE — Discharge Summary (Addendum)
Physician Discharge Summary   Patient ID: Dennis Mann MRN: [id number] DOB/AGE: [birth year] 45 y.o.  Admit date: 11/21/2020 Discharge date: 11/23/2020  Primary Care Physician:  Crist Fat, MD   Recommendations for Outpatient Follow-up:  1. Follow up with PCP in 1-2 weeks  Home Health: Patient will be returning home with hospice services Equipment/Devices:   Discharge Condition: Overall poor prognosis CODE STATUS: FULL Diet recommendation: Regular diet with thin liquids   Discharge Diagnoses:    . Acute urinary retention in the setting of neurogenic bladder . Advanced secondary progressive multiple sclerosis (HCC) Sepsis, POA, UTI . Hypertension . Hypokalemia Transient dysphagia Hematuria  Consults:  Urology Interventional radiology    Allergies:  No Known Allergies   DISCHARGE MEDICATIONS: Allergies as of 11/23/2020   No Known Allergies     Medication List    STOP taking these medications   losartan-hydrochlorothiazide 50-12.5 MG tablet Commonly known as: HYZAAR     TAKE these medications   ALPRAZolam 0.5 MG tablet Commonly known as: XANAX Take 0.25 mg by mouth every 12 (twelve) hours as needed for anxiety.   baclofen 10 MG tablet Commonly known as: LIORESAL Take 10 mg in the morning, 10 mg in the afternoon and 20 mg at night What changed:   how much to take  how to take this  when to take this   cephALEXin 500 MG capsule Commonly known as: KEFLEX Take 1 capsule (500 mg total) by mouth 3 (three) times daily for 5 days.   Dulcolax 10 MG suppository Generic drug: bisacodyl Place 10 mg rectally daily as needed for constipation. No bm in 3 days   ergocalciferol 1.25 MG (50000 UT) capsule Commonly known as: VITAMIN D2 Take 50,000 Units by mouth once a week.   methenamine 1 g tablet Commonly known as: HIPREX Take 1 g by mouth 2 (two) times daily.   morphine CONCENTRATE 10 mg / 0.5 ml concentrated solution Take 0.25-0.5 mLs by mouth every  2 (two) hours as needed for pain or shortness of breath.   ondansetron 4 MG tablet Commonly known as: ZOFRAN Take 4 mg by mouth every 4 (four) hours as needed for nausea/vomiting.        Brief H and P: For complete details please refer to admission H and P, but in brief Patient is a 45 year old male with advanced secondary progressive MS, wheelchair-bound for 6 years, nonverbal, presented with hematuria.  He was recently enrolled in hospice.  Per family, the hospice nurse had recommended transitioning from straight intermittent catheter to indwelling Foley.  However after placing the Foley, there was no urine return.  Patient's mother subsequently deflated the bulb and remove the Foley after which the patient had significant bleeding.  Hence presented to ED. In ED, patient was noted to have fever, bleeding and was started on antibiotics.  Urology was consulted.  Urology however attempted cystoscopy and were unable to place Foley.  Interventionally subsequently placed a suprapubic catheter.   Hospital Course:   Acute urinary retention, in the setting of neurogenic bladder, progressive MS -Status post suprapubic catheter placement by interventional radiology -Recommended to follow-up with urology outpatient in 2 weeks  Sepsis, POA in the setting of acute urinary retention, likely has UTI -Patient had tachycardia, leukocytosis, fevers.  Urine culture showed multiple species -Patient was placed on IV antibiotics while inpatient, transition to oral antibiotics, Keflex 500 mg 3 times daily for 5 days to complete full course.  Advanced progressive MS -Patient has advanced nonrelapsing secondary  progressive MS, now enrolled in hospice. -Palliative medicine was consulted, seen by Dr. Neale Burly.  Patient's family would like to continue with hospice     Hypertension -BP currently stable, discontinue losartan HCTZ  Hypokalemia -Replaced  Transient dysphagia -Likely due to sepsis and  lethargy at the time of admission.  Patient had swallow evaluation done today, had no significant aspiration issues, recommended regular diet with thin liquids  Penile bleeding Patient had penile bleeding today, on evaluation had dried blood on the sheets and some on the scrotum.  No active penile bleeding.  Discussed with urology, Dr. Arita Miss, this is prostate bleeding which can happen off and on for week, recommended no acute urology intervention needed.  Patient can follow-up in urology office. Explained all the above to the patient's family and they want to follow-up with Dr. Arita Miss in the office.   Day of Discharge S: No hematuria, no acute events overnight.  No fevers or chills.  Father at the bedside  BP (!) 125/92 (BP Location: Left Arm)   Pulse 87   Temp 98.8 F (37.1 C) (Oral)   Resp 18   Ht 5\' 9"  (1.753 m)   Wt 74.4 kg   SpO2 96%   BMI 24.22 kg/m   Physical Exam: General: Much more alert and oriented today, pleasant with limited verbalization HEENT: anicteric sclera, pupils reactive to light and accommodation CVS: S1-S2 clear no murmur rubs or gallops Chest: clear to auscultation bilaterally, no wheezing rales or rhonchi Abdomen: soft nontender, nondistended, normal bowel sounds Extremities: no cyanosis, clubbing or edema noted bilaterally     Get Medicines reviewed and adjusted: Please take all your medications with you for your next visit with your Primary MD  Please request your Primary MD to go over all hospital tests and procedure/radiological results at the follow up. Please ask your Primary MD to get all Hospital records sent to his/her office.  If you experience worsening of your admission symptoms, develop shortness of breath, life threatening emergency, suicidal or homicidal thoughts you must seek medical attention immediately by calling 911 or calling your MD immediately  if symptoms less severe.  You must read complete instructions/literature along with all  the possible adverse reactions/side effects for all the Medicines you take and that have been prescribed to you. Take any new Medicines after you have completely understood and accept all the possible adverse reactions/side effects.   Do not drive when taking pain medications.   Do not take more than prescribed Pain, Sleep and Anxiety Medications  Special Instructions: If you have smoked or chewed Tobacco  in the last 2 yrs please stop smoking, stop any regular Alcohol  and or any Recreational drug use.  Wear Seat belts while driving.  Please note  You were cared for by a hospitalist during your hospital stay. Once you are discharged, your primary care physician will handle any further medical issues. Please note that NO REFILLS for any discharge medications will be authorized once you are discharged, as it is imperative that you return to your primary care physician (or establish a relationship with a primary care physician if you do not have one) for your aftercare needs so that they can reassess your need for medications and monitor your lab values.   The results of significant diagnostics from this hospitalization (including imaging, microbiology, ancillary and laboratory) are listed below for reference.      Procedures/Studies:  DG Chest Port 1 View  Result Date: 11/21/2020 CLINICAL DATA:  Questionable  sepsis. EXAM: PORTABLE CHEST 1 VIEW COMPARISON:  07/26/2020. FINDINGS: Mediastinum and hilar structures normal. Heart size normal. Low lung volumes. Previously identified right base infiltrate noted on prior chest x-ray of 07/26/2020 has resolved. Tiny right pleural effusion noted. No pneumothorax. Contrast noted in the kidneys from prior abdominal CT. No acute bony abnormality. IMPRESSION: Low lung volumes. Previously identified right base infiltrate noted on prior chest x-ray of 07/26/2020 has resolved. Tiny right pleural effusion noted Electronically Signed   By: Maisie Fus  Register   On:  11/21/2020 07:49   CT IMAGE GUIDED DRAINAGE BY PERCUTANEOUS CATHETER  Result Date: 11/21/2020 INDICATION: 45 year old male with end-stage multiple sclerosis and severe urinary retention with suspected sepsis. Unable to achieve Foley catheter placement despite multiple attempts and cystoscopy in the emergency department. Patient presents for suprapubic catheter placement. EXAM: CT IMAGE GUIDED DRAINAGE BY PERCUTANEOUS CATHETER COMPARISON:  None. MEDICATIONS: None ANESTHESIA/SEDATION: Fentanyl 2 mcg IV; Versed 50 mg IV Moderate Sedation Time:  19 minutes The patient was continuously monitored during the procedure by the interventional radiology nurse under my direct supervision. CONTRAST:  None. FLUOROSCOPY TIME:  None. COMPLICATIONS: None immediate. PROCEDURE: Informed written consent was obtained from the patient after a thorough discussion of the procedural risks, benefits and alternatives. All questions were addressed. Maximal Sterile Barrier Technique was utilized including caps, mask, sterile gowns, sterile gloves, sterile drape, hand hygiene and skin antiseptic. A timeout was performed prior to the initiation of the procedure. A planning axial CT scan was performed. The distended bladder is easily visualized. A suitable avascular plane was targeted in the low midline abdomen. The overlying skin was sterilely prepped and draped in the standard fashion using chlorhexidine skin prep. Local anesthesia was attained by infiltration with 1% lidocaine. A small dermatotomy was made. Under intermittent CT guidance, an 18 gauge trocar needle was advanced into the bladder. A 0.035 wire was coiled in the bladder. The introducer needle was then removed and the skin tract was serially dilated to 14 [place]. A Cook 14 [place] all-purpose drainage catheter was then advanced over the wire and formed in the bladder. Post placement CT imaging demonstrates a well-positioned suprapubic catheter. The catheter was connected to a  Foley bag and secured to the skin with 0 Prolene suture. IMPRESSION: Successful placement of 14 French suprapubic catheter. PLAN: Return to interventional Radiology in 4-6 weeks for catheter exchange and up size. Electronically Signed   By: Malachy Moan M.D.   On: 11/21/2020 14:57       LAB RESULTS: Basic Metabolic Panel: Recent Labs  Lab 11/21/20 0753 11/22/20 0504  NA 137 140  K 2.9* 3.5  CL 107 111  CO2 20* 19*  GLUCOSE 129* 91  BUN 29* 20  CREATININE 0.99 0.78  CALCIUM 9.0 8.9  MG 2.0  --    Liver Function Tests: Recent Labs  Lab 11/21/20 0753  AST 20  ALT 19  ALKPHOS 105  BILITOT 0.7  PROT 6.9  ALBUMIN 3.8   No results for input(s): LIPASE, AMYLASE in the last 168 hours. No results for input(s): AMMONIA in the last 168 hours. CBC: Recent Labs  Lab 11/21/20 0753 11/22/20 0504  WBC 30.7* 9.2  NEUTROABS 27.5*  --   HGB 13.5 10.9*  HCT 40.3 33.9*  MCV 83.4 84.8  PLT 218 151   Cardiac Enzymes: No results for input(s): CKTOTAL, CKMB, CKMBINDEX, TROPONINI in the last 168 hours. BNP: Invalid input(s): POCBNP CBG: No results for input(s): GLUCAP in the last 168 hours.  Disposition and Follow-up: Discharge Instructions    Diet - low sodium heart healthy   Complete by: As directed    Increase activity slowly   Complete by: As directed    No wound care   Complete by: As directed        DISPOSITION: Home with hospice   DISCHARGE FOLLOW-UP  Follow-up Information    Crist Fat, MD. Schedule an appointment as soon as possible for a visit in 2 week(s).   Specialty: Internal Medicine Contact information: [address] [place] [address] [phone]        Kasandra Knudsen D, MD. Schedule an appointment as soon as possible for a visit in 2 week(s).   Specialty: Urology Why: for hospital follow-up  Contact information: [address] [place] [address] [phone]                Time coordinating  discharge:  35 minutes  Signed:   Thad Ranger M.D. [facility] 11/23/2020, 2:33 PM

## 2020-11-23 NOTE — Progress Notes (Signed)
   Dennis Mann has updated that the pt will d/c back home today. Hospice will follow up tonight once home to make sure she has no questions about medications and resume original home care services with [facility]. Norm Parcel RN

## 2020-11-23 NOTE — Progress Notes (Signed)
Provided and discussed discharge information to pt's mother. IV access removed, site clean and dry. Addressed all of famiies questions and concerns. [facility] called and awaiting transportation.  Val Eagle

## 2020-11-24 NOTE — Progress Notes (Signed)
[facility] arrived at 0100 and transported pt home with pt's father present. Information was provided and all questions answered.

## 2020-11-26 LAB — CULTURE, BLOOD (ROUTINE X 2)
Culture: NO GROWTH
Culture: NO GROWTH
Special Requests: ADEQUATE
Special Requests: ADEQUATE

## 2020-12-11 ENCOUNTER — Other Ambulatory Visit (HOSPITAL_COMMUNITY): Payer: Self-pay | Admitting: Adult Health

## 2020-12-11 DIAGNOSIS — R339 Retention of urine, unspecified: Secondary | ICD-10-CM

## 2020-12-12 ENCOUNTER — Telehealth: Payer: Self-pay | Admitting: Neurology

## 2020-12-12 NOTE — Telephone Encounter (Signed)
Called mother back. Relayed Dr. Bonnita Hollow message. Hospice nurse coming out Friday, she will see if they can check labs to check for active infection. She will try other options to try and cool him off/warm him up. Advised Dr. Epimenio Foot agreeable to him being under hospice care. He does qualify. They can help provide things more readily given that he is home bound and needs more active care. She will bring him to ED if sx worsen. Will keep follow up in June with Dr. Epimenio Foot. Advised her to call back if she has any more questions/concerns.

## 2020-12-12 NOTE — Telephone Encounter (Signed)
Pt's mother, Vincente Liberty (on [place]) called, he feels like he's hot. We have to take off all his clothes but his diaper, this last 45 mins. Then he get chills and we have to put about 5 blankets on him and start heater, last 60 mins. Want to know if there is something he can take to help. Would like a call from the  Nurse.

## 2020-12-12 NOTE — Telephone Encounter (Signed)
Called and spoke w/ mother. States pt recently in the hospital 11/21/20 for sepsis. PCP placed him on hospice [facility]) Hospice nurse recommended foley catheter instead of intermittent cath. Tried placing, was not in bladder. Mother took out, he ended up bleeding profusely and went to ED. Ended up getting infection from this. Placed on IV antibiotics and went home on antibiotics. Now has suprapubic catheter. Goes on 12/25/20 to get it changed to bigger size at urologist. Hospice coming 2x/week. He is unable to assist in transfers. Since being home, he has hot/cold spells. Strips clothes off, asks for dyson air to be blowing on him and then will ask for 5 blankets, stating he is cold. States he has no fever. Stopped olanzapine, she does not want to give this to him after reading SE. She is questioning whether he should even be on hospice. Knows he has cognitive and physical disabilities but does not agree with him being on hospice. His legs are more contracted since last seeing Dr. Epimenio Foot. His next follow up with Dr. Epimenio Foot is on 02/14/21. Advised I will send message to MD and call back with his recommendation.

## 2020-12-12 NOTE — Telephone Encounter (Signed)
Unfortunately, no meds to help hot/cold except Tylenol if hot.    Try cold compress or icepack.   Might be infection.    Hospice can have nurses stop by frequentlyt o see if patient needs labs or other services.  If worsens can go to ED (will need to sign out of hospce if goes to ED but can sign back in)

## 2020-12-18 ENCOUNTER — Other Ambulatory Visit: Payer: Self-pay

## 2020-12-18 ENCOUNTER — Encounter (HOSPITAL_COMMUNITY): Payer: Self-pay

## 2020-12-18 ENCOUNTER — Inpatient Hospital Stay (HOSPITAL_COMMUNITY)
Admission: EM | Admit: 2020-12-18 | Discharge: 2020-12-21 | DRG: 871 | Disposition: A | Discharge status: Home / Self Care | Payer: PRIVATE HEALTH INSURANCE | Attending: Internal Medicine | Admitting: Internal Medicine

## 2020-12-18 ENCOUNTER — Emergency Department (HOSPITAL_COMMUNITY): Payer: PRIVATE HEALTH INSURANCE

## 2020-12-18 DIAGNOSIS — T83090A Other mechanical complication of cystostomy catheter, initial encounter: Secondary | ICD-10-CM

## 2020-12-18 DIAGNOSIS — Z7401 Bed confinement status: Secondary | ICD-10-CM

## 2020-12-18 DIAGNOSIS — G35 Multiple sclerosis: Secondary | ICD-10-CM | POA: Diagnosis present

## 2020-12-18 DIAGNOSIS — N319 Neuromuscular dysfunction of bladder, unspecified: Secondary | ICD-10-CM | POA: Diagnosis present

## 2020-12-18 DIAGNOSIS — R319 Hematuria, unspecified: Secondary | ICD-10-CM | POA: Diagnosis present

## 2020-12-18 DIAGNOSIS — E785 Hyperlipidemia, unspecified: Secondary | ICD-10-CM | POA: Diagnosis present

## 2020-12-18 DIAGNOSIS — Y846 Urinary catheterization as the cause of abnormal reaction of the patient, or of later complication, without mention of misadventure at the time of the procedure: Secondary | ICD-10-CM | POA: Diagnosis present

## 2020-12-18 DIAGNOSIS — Y738 Miscellaneous gastroenterology and urology devices associated with adverse incidents, not elsewhere classified: Secondary | ICD-10-CM | POA: Diagnosis present

## 2020-12-18 DIAGNOSIS — Z79899 Other long term (current) drug therapy: Secondary | ICD-10-CM

## 2020-12-18 DIAGNOSIS — Z66 Do not resuscitate: Secondary | ICD-10-CM | POA: Diagnosis present

## 2020-12-18 DIAGNOSIS — I10 Essential (primary) hypertension: Secondary | ICD-10-CM | POA: Diagnosis present

## 2020-12-18 DIAGNOSIS — A419 Sepsis, unspecified organism: Principal | ICD-10-CM | POA: Diagnosis present

## 2020-12-18 DIAGNOSIS — Z8249 Family history of ischemic heart disease and other diseases of the circulatory system: Secondary | ICD-10-CM

## 2020-12-18 DIAGNOSIS — L89312 Pressure ulcer of right buttock, stage 2: Secondary | ICD-10-CM | POA: Diagnosis present

## 2020-12-18 DIAGNOSIS — U071 COVID-19: Secondary | ICD-10-CM | POA: Diagnosis present

## 2020-12-18 DIAGNOSIS — L8990 Pressure ulcer of unspecified site, unspecified stage: Secondary | ICD-10-CM | POA: Diagnosis present

## 2020-12-18 DIAGNOSIS — T83020A Displacement of cystostomy catheter, initial encounter: Secondary | ICD-10-CM | POA: Diagnosis present

## 2020-12-18 DIAGNOSIS — N390 Urinary tract infection, site not specified: Secondary | ICD-10-CM | POA: Diagnosis present

## 2020-12-18 MED ORDER — LACTATED RINGERS IV BOLUS (SEPSIS)
1000.0000 mL | Freq: Once | INTRAVENOUS | Status: AC
  Administered 2020-12-19: 1000 mL via INTRAVENOUS

## 2020-12-18 MED ORDER — METRONIDAZOLE IN NACL 5-0.79 MG/ML-% IV SOLN
500.0000 mg | Freq: Once | INTRAVENOUS | Status: AC
  Administered 2020-12-19: 500 mg via INTRAVENOUS
  Filled 2020-12-18: qty 100

## 2020-12-18 MED ORDER — LACTATED RINGERS IV SOLN: INTRAVENOUS | Status: AC

## 2020-12-18 MED ORDER — SODIUM CHLORIDE 0.9 % IV SOLN
2.0000 g | Freq: Once | INTRAVENOUS | Status: AC
  Administered 2020-12-19: 2 g via INTRAVENOUS
  Filled 2020-12-18: qty 2

## 2020-12-18 MED ORDER — VANCOMYCIN HCL IN DEXTROSE 1-5 GM/200ML-% IV SOLN
1000.0000 mg | Freq: Once | INTRAVENOUS | Status: AC
  Administered 2020-12-19: 1000 mg via INTRAVENOUS
  Filled 2020-12-18: qty 200

## 2020-12-18 NOTE — ED Notes (Signed)
X-Ray at bedside.

## 2020-12-18 NOTE — ED Provider Notes (Signed)
[facility]-EMERGENCY DEPT Provider Note   CSN: [id number] Arrival date & time: 12/18/20  2136   History Chief Complaint  Patient presents with  . Abdominal Pain    Suprapubic catheter issue    Dennis Mann is a 45 y.o. male.  The history is provided by a parent. The history is limited by the condition of the patient (Patient nonverbal).  Abdominal Pain He has history of hypertension, hyperlipidemia, advanced multiple sclerosis and is brought in because of fever at home and mother noted that his suprapubic catheter may have been pulled out.  He had a temperature of 102.1 earlier this evening.  Suprapubic catheter had been placed on March 8 and is scheduled to be swapped out for a larger catheter (current catheters 14 [place]).  Mother also noted that he was less alert than normal throughout the day.  He has not had any coughing or vomiting or diarrhea.  Past Medical History:  Diagnosis Date  . Anal fissure   . Cognitive impairment   . Essential hypertension   . Hyperlipidemia   . Multiple sclerosis [facility])     Patient Active Problem List   Diagnosis Date Noted  . Urinary retention 11/21/2020  . Hypertension 11/21/2020  . Hyperlipidemia 11/21/2020  . Hypokalemia 11/21/2020  . Multiple sclerosis (HCC) 03/12/2016    History reviewed. No pertinent surgical history.     Family History  Problem Relation Age of Onset  . Heart disease Maternal Grandfather   . Diabetes Paternal Grandfather   . Colon cancer Paternal Grandmother     Social History   Tobacco Use  . Smoking status: Never Smoker  . Smokeless tobacco: Never Used  Vaping Use  . Vaping Use: Never used  Substance Use Topics  . Alcohol use: Never  . Drug use: Never    Home Medications Prior to Admission medications   Medication Sig Start Date End Date Taking? Authorizing Provider  ALPRAZolam Prudy Feeler) 0.5 MG tablet Take 0.25 mg by mouth every 12 (twelve) hours as needed for anxiety. 10/29/20    [provider]  baclofen (LIORESAL) 10 MG tablet Take 10 mg in the morning, 10 mg in the afternoon and 20 mg at night Patient taking differently: Take 10 mg by mouth See admin instructions. Take 10 mg in the morning, 10 mg in the afternoon and 20 mg at night 08/17/20   Sater, Pearletha Furl, MD  DULCOLAX 10 MG suppository Place 10 mg rectally daily as needed for constipation. No bm in 3 days 11/20/20   [provider]  ergocalciferol (VITAMIN D2) 1.25 MG (50000 UT) capsule Take 50,000 Units by mouth once a week.    [provider]  methenamine (HIPREX) 1 g tablet Take 1 g by mouth 2 (two) times daily. 11/02/20   [provider]  Morphine Sulfate (MORPHINE CONCENTRATE) 10 mg / 0.5 ml concentrated solution Take 0.25-0.5 mLs by mouth every 2 (two) hours as needed for pain or shortness of breath. 11/20/20   [provider]  ondansetron (ZOFRAN) 4 MG tablet Take 4 mg by mouth every 4 (four) hours as needed for nausea/vomiting. 11/20/20   [provider]    Allergies    Patient has no known allergies.  Review of Systems   Review of Systems  Unable to perform ROS: Patient nonverbal  Gastrointestinal: Positive for abdominal pain.    Physical Exam Updated Vital Signs BP (!) 129/104   Pulse (!) 110   Temp 98.1 F (36.7 C) (Oral)  Resp 18   Ht 5\' 9"  (1.753 m)   Wt 75 kg   SpO2 94%   BMI 24.42 kg/m   Physical Exam Vitals and nursing note reviewed.   45 year old male, resting comfortably and in no acute distress. Vital signs are significant for elevated heart rate and blood pressure. Oxygen saturation is 94%, which is normal. Head is normocephalic and atraumatic. PERRLA, EOMI. Oropharynx is clear. Neck is supple without adenopathy or JVD. Back is nontender and there is no CVA tenderness. Lungs are clear without rales, wheezes, or rhonchi. Chest is nontender. Heart has regular rate and rhythm without murmur. Abdomen is soft, flat.  Suprapubic  catheter is in place, but the catheter has been pulled partly out.  It had a suture that was holding it in place and suture has broken. Extremities have no cyanosis or edema. Skin is warm and dry without rash. Neurologic: Awake and was able to respond to verbal stimuli.  Quadriparesis present.  ED Results / Procedures / Treatments   Labs (all labs ordered are listed, but only abnormal results are displayed) Labs Reviewed  COMPREHENSIVE METABOLIC PANEL - Abnormal; Notable for the following components:      Result Value   Glucose, Bld 100 (*)    AST 10 (*)    Alkaline Phosphatase 127 (*)    All other components within normal limits  CBC WITH DIFFERENTIAL/PLATELET - Abnormal; Notable for the following components:   Monocytes Absolute 1.2 (*)    All other components within normal limits  APTT - Abnormal; Notable for the following components:   aPTT 65 (*)    All other components within normal limits  URINALYSIS, ROUTINE W REFLEX MICROSCOPIC - Abnormal; Notable for the following components:   Color, Urine AMBER (*)    APPearance CLOUDY (*)    Specific Gravity, Urine 1.036 (*)    Hgb urine dipstick SMALL (*)    Protein, ur 100 (*)    Leukocytes,Ua SMALL (*)    RBC / HPF >50 (*)    All other components within normal limits  RESP PANEL BY RT-PCR (FLU A&B, COVID) ARPGX2  CULTURE, BLOOD (ROUTINE X 2)  CULTURE, BLOOD (ROUTINE X 2)  URINE CULTURE  LACTIC ACID, PLASMA  PROTIME-INR    EKG EKG Interpretation  Date/Time:  Tuesday December 19 2020 00:04:00 EDT Ventricular Rate:  120 PR Interval:  148 QRS Duration: 83 QT Interval:  313 QTC Calculation: 443 R Axis:   107 Text Interpretation: Sinus tachycardia Consider right atrial enlargement Right axis deviation Borderline T wave abnormalities When compared with ECG of 11/21/2020, No significant change was found Confirmed by 01/21/2021 (Dione Booze) on 12/19/2020 12:11:26 AM   Radiology DG Chest Port 1 View  Result Date: 12/18/2020 CLINICAL  DATA:  Questionable sepsis. EXAM: PORTABLE CHEST 1 VIEW.  Patient is rotated. COMPARISON:  Chest x-ray 11/21/2020 FINDINGS: The heart size and mediastinal contours are within normal limits. Low lung volumes with streaky airspace opacities at the bases. Question left upper lobe patchy airspace opacity that is poorly visualized on this study. No pulmonary edema. No pleural effusion. No pneumothorax. No acute osseous abnormality. IMPRESSION: Low lung volumes with streaky airspace opacities at the bases that likely represents atelectasis versus infection/inflammation. Question left upper lobe patchy airspace opacity that is poorly visualized on this study. Limited evaluation due to patient rotation. Recommend repeat PA and lateral view of the chest. Electronically Signed   By: 01/21/2021 M.D.   On: 12/18/2020 23:59  Procedures Procedures  CRITICAL CARE Performed by: Dione Booze Total critical care time: 40 minutes Critical care time was exclusive of separately billable procedures and treating other patients. Critical care was necessary to treat or prevent imminent or life-threatening deterioration. Critical care was time spent personally by me on the following activities: development of treatment plan with patient and/or surrogate as well as nursing, discussions with consultants, evaluation of patient's response to treatment, examination of patient, obtaining history from patient or surrogate, ordering and performing treatments and interventions, ordering and review of laboratory studies, ordering and review of radiographic studies, pulse oximetry and re-evaluation of patient's condition.  Medications Ordered in ED Medications  lactated ringers infusion (has no administration in time range)  lactated ringers bolus 1,000 mL (1,000 mLs Intravenous New Bag/Given 12/19/20 0143)  ceFEPIme (MAXIPIME) 2 g in sodium chloride 0.9 % 100 mL IVPB (2 g Intravenous New Bag/Given 12/19/20 0145)  metroNIDAZOLE  (FLAGYL) IVPB 500 mg (has no administration in time range)  vancomycin (VANCOCIN) IVPB 1000 mg/200 mL premix (has no administration in time range)    ED Course  I have reviewed the triage vital signs and the nursing notes.  Pertinent labs & imaging results that were available during my care of the patient were reviewed by me and considered in my medical decision making (see chart for details).  MDM Rules/Calculators/A&P Fever with physiologic signs concerning for sepsis.  Suprapubic catheter was advanced easily and was draining clear urine.  I did speak with his urologist, Dr. Arita Miss, who felt that no other urologic intervention was needed tonight.  Because of report of fever and tachycardia, he is started on sepsis pathway and is started on empiric antibiotics.  Old records are reviewed confirming hospitalization last month for sepsis with placement of suprapubic catheter.  Of note, he is on hospice for his advanced multiple sclerosis.  Urinalysis does not actually show signs of infection, but chest x-ray shows questionable right upper lobe airspace disease as well as basilar atelectasis versus infiltrate.  CBC and metabolic panel are unremarkable.  ECG shows sinus tachycardia.  Case is discussed with Dr. Loney Loh of [facility], who agrees to admit the patient.  He will need consultation with interventional radiology to change out his suprapubic catheter in the morning.  Final Clinical Impression(s) / ED Diagnoses Final diagnoses:  Sepsis due to undetermined organism (HCC)  Elevated blood pressure reading with diagnosis of hypertension  Mechanical complication of suprapubic catheter, initial encounter [facility])    Rx / DC Orders ED Discharge Orders    None       Dione Booze, MD 12/19/20 [id number]

## 2020-12-18 NOTE — ED Triage Notes (Signed)
Brought in by EMS from home. Has hospice care at home. Bedbound at baseline. EMS reports AMS. Suprapubic cath has been pulled out. Lives at home with mother

## 2020-12-19 ENCOUNTER — Encounter (HOSPITAL_COMMUNITY): Payer: Self-pay | Admitting: Internal Medicine

## 2020-12-19 ENCOUNTER — Observation Stay (HOSPITAL_COMMUNITY): Payer: PRIVATE HEALTH INSURANCE

## 2020-12-19 DIAGNOSIS — I10 Essential (primary) hypertension: Secondary | ICD-10-CM | POA: Diagnosis not present

## 2020-12-19 DIAGNOSIS — E7800 Pure hypercholesterolemia, unspecified: Secondary | ICD-10-CM | POA: Diagnosis not present

## 2020-12-19 DIAGNOSIS — A419 Sepsis, unspecified organism: Secondary | ICD-10-CM | POA: Diagnosis not present

## 2020-12-19 DIAGNOSIS — G35 Multiple sclerosis: Secondary | ICD-10-CM | POA: Diagnosis not present

## 2020-12-19 DIAGNOSIS — T83090A Other mechanical complication of cystostomy catheter, initial encounter: Secondary | ICD-10-CM | POA: Diagnosis not present

## 2020-12-19 DIAGNOSIS — Z7189 Other specified counseling: Secondary | ICD-10-CM

## 2020-12-19 DIAGNOSIS — Z515 Encounter for palliative care: Secondary | ICD-10-CM

## 2020-12-19 DIAGNOSIS — L8990 Pressure ulcer of unspecified site, unspecified stage: Secondary | ICD-10-CM | POA: Diagnosis present

## 2020-12-19 LAB — PROCALCITONIN: Procalcitonin: 0.18 ng/mL

## 2020-12-19 LAB — C-REACTIVE PROTEIN: CRP: 2.5 mg/dL — ABNORMAL HIGH (ref <1.0)

## 2020-12-19 LAB — CBC WITH DIFFERENTIAL/PLATELET
Abs Immature Granulocytes: 0.07 10*3/uL (ref 0.00–0.07)
Basophils Absolute: 0.1 10*3/uL (ref 0.0–0.1)
Basophils Relative: 1 %
Eosinophils Absolute: 0.2 10*3/uL (ref 0.0–0.5)
Eosinophils Relative: 2 %
HCT: 44.4 % (ref 39.0–52.0)
Hemoglobin: 14.4 g/dL (ref 13.0–17.0)
Immature Granulocytes: 1 %
Lymphocytes Relative: 13 %
Lymphs Abs: 1 10*3/uL (ref 0.7–4.0)
MCH: 27.3 pg (ref 26.0–34.0)
MCHC: 32.4 g/dL (ref 30.0–36.0)
MCV: 84.1 fL (ref 80.0–100.0)
Monocytes Absolute: 1.2 10*3/uL — ABNORMAL HIGH (ref 0.1–1.0)
Monocytes Relative: 15 %
Neutro Abs: 5.6 10*3/uL (ref 1.7–7.7)
Neutrophils Relative %: 68 %
Platelets: 204 10*3/uL (ref 150–400)
RBC: 5.28 MIL/uL (ref 4.22–5.81)
RDW: 13.4 % (ref 11.5–15.5)
WBC: 8.1 10*3/uL (ref 4.0–10.5)
nRBC: 0 % (ref 0.0–0.2)

## 2020-12-19 LAB — URINALYSIS, ROUTINE W REFLEX MICROSCOPIC
Bacteria, UA: NONE SEEN
Bilirubin Urine: NEGATIVE
Glucose, UA: NEGATIVE mg/dL
Ketones, ur: NEGATIVE mg/dL
Nitrite: NEGATIVE
Protein, ur: 100 mg/dL — AB
RBC / HPF: >50 RBC/hpf — ABNORMAL HIGH (ref 0–5)
Specific Gravity, Urine: 1.036 — ABNORMAL HIGH (ref 1.005–1.030)
pH: 5 (ref 5.0–8.0)

## 2020-12-19 LAB — D-DIMER, QUANTITATIVE: D-Dimer, Quant: 0.87 ug/mL-FEU — ABNORMAL HIGH (ref 0.00–0.50)

## 2020-12-19 LAB — COMPREHENSIVE METABOLIC PANEL
ALT: 16 U/L (ref 0–44)
AST: 10 U/L — ABNORMAL LOW (ref 15–41)
Albumin: 4.2 g/dL (ref 3.5–5.0)
Alkaline Phosphatase: 127 U/L — ABNORMAL HIGH (ref 38–126)
Anion gap: 10 (ref 5–15)
BUN: 15 mg/dL (ref 6–20)
CO2: 25 mmol/L (ref 22–32)
Calcium: 9.6 mg/dL (ref 8.9–10.3)
Chloride: 102 mmol/L (ref 98–111)
Creatinine, Ser: 0.84 mg/dL (ref 0.61–1.24)
GFR, Estimated: >60 mL/min (ref >60)
Glucose, Bld: 100 mg/dL — ABNORMAL HIGH (ref 70–99)
Potassium: 3.6 mmol/L (ref 3.5–5.1)
Sodium: 137 mmol/L (ref 135–145)
Total Bilirubin: 0.9 mg/dL (ref 0.3–1.2)
Total Protein: 7.8 g/dL (ref 6.5–8.1)

## 2020-12-19 LAB — PROTIME-INR
INR: 1.1 (ref 0.8–1.2)
Prothrombin Time: 13.6 seconds (ref 11.4–15.2)

## 2020-12-19 LAB — RESP PANEL BY RT-PCR (FLU A&B, COVID) ARPGX2
Influenza A by PCR: NEGATIVE
Influenza B by PCR: NEGATIVE
SARS Coronavirus 2 by RT PCR: POSITIVE — AB

## 2020-12-19 LAB — FERRITIN: Ferritin: 184 ng/mL (ref 24–336)

## 2020-12-19 LAB — APTT: aPTT: 65 seconds — ABNORMAL HIGH (ref 24–36)

## 2020-12-19 LAB — LACTIC ACID, PLASMA: Lactic Acid, Venous: 0.8 mmol/L (ref 0.5–1.9)

## 2020-12-19 MED ORDER — SENNOSIDES-DOCUSATE SODIUM 8.6-50 MG PO TABS
1.0000 | ORAL_TABLET | Freq: Every day | ORAL | Status: DC
  Administered 2020-12-19 – 2020-12-20 (×2): 1 via ORAL
  Filled 2020-12-19 (×3): qty 1

## 2020-12-19 MED ORDER — METRONIDAZOLE IN NACL 5-0.79 MG/ML-% IV SOLN
500.0000 mg | Freq: Three times a day (TID) | INTRAVENOUS | Status: DC
  Administered 2020-12-19 – 2020-12-20 (×3): 500 mg via INTRAVENOUS
  Filled 2020-12-19 (×3): qty 100

## 2020-12-19 MED ORDER — ACETAMINOPHEN 325 MG PO TABS
650.0000 mg | ORAL_TABLET | Freq: Four times a day (QID) | ORAL | Status: DC | PRN
  Administered 2020-12-19 – 2020-12-21 (×4): 650 mg via ORAL
  Filled 2020-12-19 (×4): qty 2

## 2020-12-19 MED ORDER — ENOXAPARIN SODIUM 40 MG/0.4ML ~~LOC~~ SOLN
40.0000 mg | SUBCUTANEOUS | Status: DC
  Administered 2020-12-19 – 2020-12-20 (×2): 40 mg via SUBCUTANEOUS
  Filled 2020-12-19 (×2): qty 0.4

## 2020-12-19 MED ORDER — POLYETHYLENE GLYCOL 3350 17 G PO PACK
17.0000 g | PACK | Freq: Every day | ORAL | Status: DC
  Administered 2020-12-19: 17 g via ORAL
  Filled 2020-12-19: qty 1

## 2020-12-19 MED ORDER — BISACODYL 10 MG RE SUPP: 10.0000 mg | Freq: Every day | RECTAL | Status: DC | PRN

## 2020-12-19 MED ORDER — ONDANSETRON HCL 4 MG PO TABS
4.0000 mg | ORAL_TABLET | ORAL | Status: DC | PRN
  Administered 2020-12-19 – 2020-12-20 (×2): 4 mg via ORAL
  Filled 2020-12-19 (×2): qty 1

## 2020-12-19 MED ORDER — BACLOFEN 10 MG PO TABS: 10.0000 mg | ORAL_TABLET | ORAL | Status: DC

## 2020-12-19 MED ORDER — MORPHINE SULFATE (CONCENTRATE) 10 MG/0.5ML PO SOLN: 5.0000 mg | ORAL | Status: DC | PRN

## 2020-12-19 MED ORDER — SODIUM CHLORIDE 0.9 % IV SOLN
1.0000 g | INTRAVENOUS | Status: DC
  Administered 2020-12-19 – 2020-12-20 (×2): 1 g via INTRAVENOUS
  Filled 2020-12-19 (×2): qty 10

## 2020-12-19 MED ORDER — LACTATED RINGERS IV BOLUS: 1000.0000 mL | Freq: Once | INTRAVENOUS | Status: DC

## 2020-12-19 MED ORDER — BACLOFEN 10 MG PO TABS
10.0000 mg | ORAL_TABLET | Freq: Every day | ORAL | Status: DC
  Administered 2020-12-19 – 2020-12-20 (×2): 10 mg via ORAL
  Filled 2020-12-19 (×2): qty 1

## 2020-12-19 MED ORDER — BACLOFEN 10 MG PO TABS
10.0000 mg | ORAL_TABLET | Freq: Three times a day (TID) | ORAL | Status: DC
  Administered 2020-12-19 – 2020-12-20 (×6): 10 mg via ORAL
  Filled 2020-12-19 (×7): qty 1

## 2020-12-19 MED ORDER — ONDANSETRON HCL 4 MG/2ML IJ SOLN
4.0000 mg | Freq: Four times a day (QID) | INTRAMUSCULAR | Status: DC | PRN
  Filled 2020-12-19: qty 2

## 2020-12-19 NOTE — Progress Notes (Addendum)
Patient ID: Isaia Hassell, male   DOB: 1975-10-19, 45 y.o.   MRN: [id number] Request received today for suprapubic catheter exchange on patient.  He underwent initial placement on 11/21/2020 and was recently scheduled for tube exchange and upsizing on 12/25/2020.  He was admitted to [facility] on 12/18/20 with concern of dislodged suprapubic catheter as well as fever.  Subsequently found to be COVID-19 positive.  Urine culture pending.  UA with negative nitrite, small leukocytes.  CT pelvis performed today revealed well-positioned suprapubic catheter within the bladder lumen with no evidence of pelvic urinoma or fluid collection along the course of the catheter, potential component of ileus of the colon.  Case has been discussed with Drs. Shelly Rubenstein and Reunion.  Plan at this time is to hold off on catheter exchange until patient off Covid protocol.  Attempts were made today to place StatLock device over catheter to secure it in place, however upon entry to room mother stated that she did not want that done and catheter is going to be "exchanged today".  Attempts were made to explain to mother findings of CT and need to wait until Covid protocol on pt is over before performing routine exchange of catheter that is currently well-positioned/functioning by imaging. She insisted that we speak with Drs. Pace/Grunz again which we did and they are in full agreement with our plans for cath exchange once pt has cleared Covid isolation protocol . Above also discussed with patient's nurse.  StatLock device given to patient's nurse in case mother allows placement later. Will continue to monitor and await further input from other MD's before any additional intervention.

## 2020-12-19 NOTE — Progress Notes (Signed)
A consult was received from an ED physician for vancomycin and cefepime per pharmacy dosing.  The patient's profile has been reviewed for ht/wt/allergies/indication/available labs.   A one time order has been placed for vancomycin 1gm and cefepime 2gm.    Further antibiotics/pharmacy consults should be ordered by admitting physician if indicated.                       Thank you, Arley Phenix RPh 12/19/2020, 12:43 AM

## 2020-12-19 NOTE — Consult Note (Signed)
Consultation Note Date: 12/19/2020   Patient Name: Dennis Mann  DOB: 07/21/1976  MRN: [id number]  Age / Sex: 45 y.o., male  PCP: Crist Fat, MD Referring Physician: Tyrone Nine, MD  Reason for Consultation: Establishing goals of care  HPI/Patient Profile: 45 y.o. male  with past medical history of longstanding, end-stage MS, neurogenic bladder, suprapubic catheter, recurrent UTI, aspiration pneumonia admitted on 12/18/2020 with dislodged suprapubic catheter and fever at home.  He is currently admitted and being treated for potential UTI.  He was also incidentally found to be Covid positive.  He is currently at home with hospice services through [facility].  Palliative consulted for support and establishing goals of care.  Clinical Assessment and Goals of Care: I saw and examined Dennis Mann today.  His mother, Dois Davenport, is at the bedside as well.  We discussed his clinical course since I last saw him around a month ago and he has been doing well at home with the assistance of hospice services.  We reviewed events leading to this hospitalization and discussed plan for him to transition back home with hospice support at time of discharge.  His mother expressed that she understands he is going to continue to have decline overall, and we discussed that each time he gets sick, he does not really ever get back to his prior baseline.  She reports that she also discussed with hospice MD that there is going to come a time where he gets sick and, " just drops off rather than recovering."  We reviewed plan for exchanging suprapubic catheter.  She reports speaking with IR who stated they do not want to change catheter this admission.  She request that they reach out and discuss with Dr. Arita Miss, whom she talked with earlier today about plan to exchange catheter.  SUMMARY OF RECOMMENDATIONS   - Await decision regarding timing of  replacement for suprapubic catheter.  Alta's mother reports discussing plan for replacement prior to discharge with Dr. Arita Miss and Dr. Jarvis Newcomer this morning.  She strongly feels that it should be done prior to him leaving the hospital but IR noted to be planning to wait until he is off of Covid precautions.  IR is going to discuss timing of exchange with Dr. Arita Miss per request of Tristan's mother. - Overall goal remains to add as much time and quality to [facility] life as possible including treatment of potentially treatable conditions such as current urinary tract infection.  Plan is for him to transition home with continued services through home hospice with [facility] at time of discharge.  Code Status/Advance Care Planning:  DNR  Palliative Prophylaxis:   Delirium Protocol  Psycho-social/Spiritual:   Desire for further Chaplaincy support: Did not address  Additional Recommendations: Caregiving  Support/Resources and Education on Hospice  Prognosis:   < 6 months  Discharge Planning: Home with Hospice when he is medically ready for discharge     Primary Diagnoses: Present on Admission: . Sepsis (HCC) . Mechanical complication of suprapubic catheter (HCC) . Hypertension .  Multiple sclerosis (HCC) . Hyperlipidemia . Pressure injury of skin   I have reviewed the medical record, interviewed the patient and family, and examined the patient. The following aspects are pertinent.  Past Medical History:  Diagnosis Date  . Anal fissure   . Cognitive impairment   . Essential hypertension   . Hyperlipidemia   . Multiple sclerosis (HCC)    Social History   Socioeconomic History  . Marital status: Single    Spouse name: Not on file  . Number of children: Not on file  . Years of education: Not on file  . Highest education level: Not on file  Occupational History  . Not on file  Tobacco Use  . Smoking status: Never Smoker  . Smokeless tobacco: Never Used  Vaping Use  . Vaping  Use: Never used  Substance and Sexual Activity  . Alcohol use: Never  . Drug use: Never  . Sexual activity: Not on file  Other Topics Concern  . Not on file  Social History Narrative   Lives   Caffeine use:    Social Determinants of Health   Financial Resource Strain: Not on file  Food Insecurity: Not on file  Transportation Needs: Not on file  Physical Activity: Not on file  Stress: Not on file  Social Connections: Not on file   Family History  Problem Relation Age of Onset  . Heart disease Maternal Grandfather   . Diabetes Paternal Grandfather   . Colon cancer Paternal Grandmother    Scheduled Meds: . baclofen  10 mg Oral TID   And  . baclofen  10 mg Oral QHS  . enoxaparin (LOVENOX) injection  40 mg Subcutaneous Q24H  . polyethylene glycol  17 g Oral Daily  . senna-docusate  1 tablet Oral Daily   Continuous Infusions: . cefTRIAXone (ROCEPHIN)  IV 1 g (12/19/20 1244)  . lactated ringers    . lactated ringers 150 mL/hr at 12/19/20 1016  . metronidazole 500 mg (12/19/20 1322)   PRN Meds:.acetaminophen, bisacodyl, morphine CONCENTRATE, ondansetron (ZOFRAN) IV, ondansetron Medications Prior to Admission:  Prior to Admission medications   Medication Sig Start Date End Date Taking? Authorizing Provider  baclofen (LIORESAL) 10 MG tablet Take 10 mg in the morning, 10 mg in the afternoon and 20 mg at night Patient taking differently: Take 10 mg by mouth See admin instructions. Take 10 mg in the morning, 10 mg in the afternoon and 20 mg at night 08/17/20  Yes Sater, Pearletha Furl, MD  DULCOLAX 10 MG suppository Place 10 mg rectally daily as needed for constipation. No bm in 3 days 11/20/20  Yes [provider]  methenamine (HIPREX) 1 g tablet Take 1 g by mouth 2 (two) times daily. 11/02/20  Yes [provider]  Morphine Sulfate (MORPHINE CONCENTRATE) 10 mg / 0.5 ml concentrated solution Take 0.25-0.5 mLs by mouth every 2 (two) hours as needed for pain or shortness of  breath. 11/20/20  Yes [provider]  ondansetron (ZOFRAN) 4 MG tablet Take 4 mg by mouth every 4 (four) hours as needed for nausea/vomiting. 11/20/20  Yes [provider]   No Known Allergies Review of Systems  Unreliable historian.  No specific complaints.  requests multiple times to "stand up."  Physical Exam  General: Alert, awake, in no acute distress.  Lungs: Good air movement, clear Abdomen: nondistended Ext: No significant edema Skin: Warm and dry Neuro: Alert, no insight into his situation  Vital Signs: BP (!) 147/92 (BP Location:  Left Arm)   Pulse (!) 115   Temp 98.4 F (36.9 C) (Oral)   Resp 20   Ht 5\' 9"  (1.753 m)   Wt 75 kg   SpO2 96%   BMI 24.42 kg/m  Pain Scale: 0-10   Pain Score: 0-No pain   SpO2: SpO2: 96 % O2 Device:SpO2: 96 % O2 Flow Rate: .   IO: Intake/output summary:   Intake/Output Summary (Last 24 hours) at 12/19/2020 1651 Last data filed at 12/19/2020 1631 Gross per 24 hour  Intake 3127.63 ml  Output 1075 ml  Net 2052.63 ml    LBM:   Baseline Weight: Weight: 75 kg Most recent weight: Weight: 75 kg     Palliative Assessment/Data:     Time In: 1430 Time Out: 1520 Time Total: 50 Greater than 50%  of this time was spent counseling and coordinating care related to the above assessment and plan.  Signed by: 02/18/2021, MD   Please contact Palliative Medicine Team phone at [phone] for questions and concerns.  For individual provider: See [phone]

## 2020-12-19 NOTE — Consult Note (Signed)
I have been asked to see the patient by Dr. Hazeline Junker, for evaluation and management of suprapubic tube.  History of present illness: 45 year old man with a history of progressive MS recently enrolled in hospice who presented to the ED approximately 1 month ago with traumatic Foley insertion.  Multiple attempts at placing Foley catheter were attempted prior to urology consult and patient had a significant false passage in the prostatic urethra.  Suprapubic tube was placed by IR at that time.  Patient comes into the ED tonight with SP tube not draining.  It was advanced by the emergency room physician and is now draining well.  He was admitted to the hospital for fever and concern for infection.  Urology was consulted for assistance with coordinating IR SP tube exchange.   Review of systems: A 12 point comprehensive review of systems was obtained and is negative unless otherwise stated in the history of present illness.  Patient Active Problem List   Diagnosis Date Noted  . Sepsis (HCC) 12/19/2020  . Urinary retention 11/21/2020  . Hypertension 11/21/2020  . Hyperlipidemia 11/21/2020  . Hypokalemia 11/21/2020  . Multiple sclerosis (HCC) 03/12/2016    No current facility-administered medications on file prior to encounter.   Current Outpatient Medications on File Prior to Encounter  Medication Sig Dispense Refill  . baclofen (LIORESAL) 10 MG tablet Take 10 mg in the morning, 10 mg in the afternoon and 20 mg at night (Patient taking differently: Take 10 mg by mouth See admin instructions. Take 10 mg in the morning, 10 mg in the afternoon and 20 mg at night) 120 each 5  . DULCOLAX 10 MG suppository Place 10 mg rectally daily as needed for constipation. No bm in 3 days    . methenamine (HIPREX) 1 g tablet Take 1 g by mouth 2 (two) times daily.    . Morphine Sulfate (MORPHINE CONCENTRATE) 10 mg / 0.5 ml concentrated solution Take 0.25-0.5 mLs by mouth every 2 (two) hours as needed for pain or  shortness of breath.    . ondansetron (ZOFRAN) 4 MG tablet Take 4 mg by mouth every 4 (four) hours as needed for nausea/vomiting.      Past Medical History:  Diagnosis Date  . Anal fissure   . Cognitive impairment   . Essential hypertension   . Hyperlipidemia   . Multiple sclerosis (HCC)     History reviewed. No pertinent surgical history.  Social History   Tobacco Use  . Smoking status: Never Smoker  . Smokeless tobacco: Never Used  Vaping Use  . Vaping Use: Never used  Substance Use Topics  . Alcohol use: Never  . Drug use: Never    Family History  Problem Relation Age of Onset  . Heart disease Maternal Grandfather   . Diabetes Paternal Grandfather   . Colon cancer Paternal Grandmother     PE: Vitals:   12/19/20 0300 12/19/20 0330 12/19/20 0400 12/19/20 0500  BP: (!) 138/97 (!) 138/95 (!) 162/95 (!) 149/95  Pulse: (!) 113 (!) 114 (!) 116 (!) 118  Resp: 17 20 20 20   Temp:      TempSrc:      SpO2: 95% 98% 97% 93%  Weight:      Height:       Patient appears to be in no acute distress  patient is alert and oriented x3 Atraumatic normocephalic head No increased work of breathing Regular sinus rhythm/rate Abdomen is soft, nontender, nondistended, SP tube draining clear yellow urine No  identifiable skin lesions  Recent Labs    12/18/20 0054  WBC 8.1  HGB 14.4  HCT 44.4   Recent Labs    12/18/20 0054  NA 137  K 3.6  CL 102  CO2 25  GLUCOSE 100*  BUN 15  CREATININE 0.84  CALCIUM 9.6   Recent Labs    12/18/20 0054  INR 1.1   No results for input(s): LABURIN in the last 72 hours. Results for orders placed or performed during the hospital encounter of 12/18/20  Resp Panel by RT-PCR (Flu A&B, Covid) Nasopharyngeal Swab     Status: Abnormal   Collection Time: 12/19/20 12:30 AM   Specimen: Nasopharyngeal Swab; Nasopharyngeal(NP) swabs in vial transport medium  Result Value Ref Range Status   SARS Coronavirus 2 by RT PCR POSITIVE (A) NEGATIVE  Final    Comment: RESULT CALLED TO, READ BACK BY AND VERIFIED WITH: TJ, RN @ 0446 ON 12/19/20 C VARNER (NOTE) SARS-CoV-2 target nucleic acids are DETECTED.  The SARS-CoV-2 RNA is generally detectable in upper respiratory specimens during the acute phase of infection. Positive results are indicative of the presence of the identified virus, but do not rule out bacterial infection or co-infection with other pathogens not detected by the test. Clinical correlation with patient history and other diagnostic information is necessary to determine patient infection status. The expected result is Negative.  Fact Sheet for Patients: BloggerCourse.com  Fact Sheet for Healthcare Providers: SeriousBroker.it  This test is not yet approved or cleared by the [place] FDA and  has been authorized for detection and/or diagnosis of SARS-CoV-2 by FDA under an Emergency Use Authorization (EUA).  This EUA will remain in effect (meaning this test can be  used) for the duration of  the COVID-19 declaration under Section 564(b)(1) of the Act, 21 U.S.C. section 360bbb-3(b)(1), unless the authorization is terminated or revoked sooner.     Influenza A by PCR NEGATIVE NEGATIVE Final   Influenza B by PCR NEGATIVE NEGATIVE Final    Comment: (NOTE) The Xpert Xpress SARS-CoV-2/FLU/RSV plus assay is intended as an aid in the diagnosis of influenza from Nasopharyngeal swab specimens and should not be used as a sole basis for treatment. Nasal washings and aspirates are unacceptable for Xpert Xpress SARS-CoV-2/FLU/RSV testing.  Fact Sheet for Patients: BloggerCourse.com  Fact Sheet for Healthcare Providers: SeriousBroker.it  This test is not yet approved or cleared by the [place] FDA and has been authorized for detection and/or diagnosis of SARS-CoV-2 by FDA under an Emergency Use Authorization (EUA).  This EUA will remain in effect (meaning this test can be used) for the duration of the COVID-19 declaration under Section 564(b)(1) of the Act, 21 U.S.C. section 360bbb-3(b)(1), unless the authorization is terminated or revoked.  Performed at [facility], [address] [place], [place] [address]   Blood Culture (routine x 2)     Status: None (Preliminary result)   Collection Time: 12/19/20 12:54 AM   Specimen: BLOOD RIGHT FOREARM  Result Value Ref Range Status   Specimen Description   Final    BLOOD RIGHT FOREARM Performed at [facility], [address], [place], [place] [address]    Special Requests   Final    BOTTLES DRAWN AEROBIC AND ANAEROBIC Blood Culture results may not be optimal due to an inadequate volume of blood received in culture bottles Performed at [facility], [address] [place], [place] [address]    Culture PENDING  Incomplete  Report Status PENDING  Incomplete      Imp/Recommendations: 45 year old man with advanced MS and neurogenic bladder currently managed with suprapubic tube.  -Recommend SP tube change by IR to upsize to 16 [place] -After 4 weeks 16 [place] this catheter can be changed either in the urology office or by home health -No acute urologic intervention necessary  Thank you for involving me in this patient's care. Please page with any further questions or concerns. Mohmmad Saleeby D Chasta Deshpande

## 2020-12-19 NOTE — ED Notes (Signed)
Hospitalist paged regarding pt heart rate being 110-120's and intermittent "sharp pain" that occurs in pt head.

## 2020-12-19 NOTE — Plan of Care (Signed)

## 2020-12-19 NOTE — Progress Notes (Addendum)
   Mr. Dennis Mann is a current hospice pt under the care of hospice of [facility] at home. His mother Dennis Mann is his primary care giver. She can be reached at [phone]. The pt was enrolled to hospice care on 11/20/20 with primary diagnosis of Multiple sclerosis.   At home the pt the night before proceeding to the ED had been having some intermittent fevers. We were to go out the next morning to get urine sample but his mother noted that the suprapubic had been pulled out and the stitch was no longer in effect. This is a new supra pubic catheter for pt so it was decided with family best option was to go to the hospital to have it replaced.  I have spoke to the pt's mother and she does want to continue with treatment of IV antibiotics at this time. She is also in agreement with pt returning home at d/c with her and hospice care.   Thank you with your help with this pt.   This is a related Mann admission related to hospice diagnosis.  Norm Parcel RN [phone]

## 2020-12-19 NOTE — Plan of Care (Signed)
  Problem: Education: Goal: Knowledge of General Education information will improve Description: Including pain rating scale, medication(s)/side effects and non-pharmacologic comfort measures Outcome: Progressing   Problem: Clinical Measurements: Goal: Will remain free from infection Outcome: Progressing Goal: Diagnostic test results will improve Outcome: Progressing   Problem: Activity: Goal: Risk for activity intolerance will decrease Outcome: Progressing   Problem: Elimination: Goal: Will not experience complications related to urinary retention Outcome: Progressing

## 2020-12-19 NOTE — H&P (Signed)
History and Physical   Dennis Mann JME:[id number] DOB: 1975-12-01 DOA: 12/18/2020  Referring MD/NP/PA: Dr. Preston Fleeting, EDP PCP: Crist Fat, MD Outpatient Specialists: Urology, Dr. Arita Miss  Patient coming from: Home by way of [facility]  Chief Complaint: Dislodged suprapubic catheter, fever  HPI: Dennis Mann is a 45 y.o. male with a history of longstanding, end-stage MS followed by home hospice, neurogenic bladder with difficulty placing catheters, now with suprapubic catheter, and recurrent UTI, hx aspiration pneumonia who presented to [facility] from home due to dislodged suprapubic catheter earlier that day. A fever was also noted at home. There has been no recent cough, shortness of breath. No reports of chest pain, vomiting or diarrhea.   ED Course: Sinus tachycardia worse than his baseline. Suprapubic catheter was advanced with clear urine return by EDP. UTI with small WBCs, >50RBCs. CXR with bibasilar opacity (atelectasis vs. infiltrate) and new patchy RUL faint densities. Broad abx started and patient admitted to [facility].   Review of Systems: Having intermittent fevers, otherwise per HPI. All others reviewed and are negative.   Past Medical History:  Diagnosis Date  . Anal fissure   . Cognitive impairment   . Essential hypertension   . Hyperlipidemia   . Multiple sclerosis (HCC)    History reviewed. No pertinent surgical history. - Nonsmoker, lives with mother.  reports that he has never smoked. He has never used smokeless tobacco. He reports that he does not drink alcohol and does not use drugs. No Known Allergies Family History  Problem Relation Age of Onset  . Heart disease Maternal Grandfather   . Diabetes Paternal Grandfather   . Colon cancer Paternal Grandmother    - Family history otherwise reviewed and not pertinent.  Prior to Admission medications   Medication Sig Start Date End Date Taking? Authorizing Provider  baclofen (LIORESAL) 10 MG tablet Take 10 mg in the morning, 10 mg in  the afternoon and 20 mg at night Patient taking differently: Take 10 mg by mouth See admin instructions. Take 10 mg in the morning, 10 mg in the afternoon and 20 mg at night 08/17/20  Yes Sater, Pearletha Furl, MD  DULCOLAX 10 MG suppository Place 10 mg rectally daily as needed for constipation. No bm in 3 days 11/20/20  Yes [provider]  methenamine (HIPREX) 1 g tablet Take 1 g by mouth 2 (two) times daily. 11/02/20  Yes [provider]  Morphine Sulfate (MORPHINE CONCENTRATE) 10 mg / 0.5 ml concentrated solution Take 0.25-0.5 mLs by mouth every 2 (two) hours as needed for pain or shortness of breath. 11/20/20  Yes [provider]  ondansetron (ZOFRAN) 4 MG tablet Take 4 mg by mouth every 4 (four) hours as needed for nausea/vomiting. 11/20/20  Yes [provider]    Physical Exam: Vitals:   12/19/20 0300 12/19/20 0330 12/19/20 0400 12/19/20 0500  BP: (!) 138/97 (!) 138/95 (!) 162/95 (!) 149/95  Pulse: (!) 113 (!) 114 (!) 116 (!) 118  Resp: 17 20 20 20   Temp:      TempSrc:      SpO2: 95% 98% 97% 93%  Weight:      Height:       Constitutional: 45 y.o. male in no distress, calm demeanor Eyes: Lids and conjunctivae normal, slight bilateral ptosis, PERRL ENMT: Mucous membranes are tacky. Posterior pharynx clear of any exudate or lesions. Fair dentition.  Neck: Supple, no masses, no thyromegaly Respiratory: Non-labored breathing room air, RR ~20/min without accessory muscle use. Clear breath  sounds to auscultation bilaterally Cardiovascular: Regular tachycardia, no murmurs, rubs, or gallops. No JVD. No LE edema. Palpable pedal pulses. Abdomen: Normoactive bowel sounds. No tenderness, non-distended. Suprapubic cath site  with new dressing and some discharge, no bleeding from site. No spreading erythema beyond dressing. Musculoskeletal: Spastic Skin: Warm, dry. No other wounds on visualized skin. Not turned for exam. Neurologic: CN II-XII grossly intact. Moves UE's,  decreased muscle bulk.   Psychiatric: Alert and incompletely oriented. Intact judgment and insight.  Labs on Admission: I have personally reviewed following labs and imaging studies  CBC: Recent Labs  Lab 12/18/20 0054  WBC 8.1  NEUTROABS 5.6  HGB 14.4  HCT 44.4  MCV 84.1  PLT 204   Basic Metabolic Panel: Recent Labs  Lab 12/18/20 0054  NA 137  K 3.6  CL 102  CO2 25  GLUCOSE 100*  BUN 15  CREATININE 0.84  CALCIUM 9.6   GFR: Estimated Creatinine Clearance: 109.9 mL/min (by C-G formula based on SCr of 0.84 mg/dL). Liver Function Tests: Recent Labs  Lab 12/18/20 0054  AST 10*  ALT 16  ALKPHOS 127*  BILITOT 0.9  PROT 7.8  ALBUMIN 4.2   No results for input(s): LIPASE, AMYLASE in the last 168 hours. No results for input(s): AMMONIA in the last 168 hours. Coagulation Profile: Recent Labs  Lab 12/18/20 0054  INR 1.1   Cardiac Enzymes: No results for input(s): CKTOTAL, CKMB, CKMBINDEX, TROPONINI in the last 168 hours. BNP (last 3 results) No results for input(s): PROBNP in the last 8760 hours. HbA1C: No results for input(s): HGBA1C in the last 72 hours. CBG: No results for input(s): GLUCAP in the last 168 hours. Lipid Profile: No results for input(s): CHOL, HDL, LDLCALC, TRIG, CHOLHDL, LDLDIRECT in the last 72 hours. Thyroid Function Tests: No results for input(s): TSH, T4TOTAL, FREET4, T3FREE, THYROIDAB in the last 72 hours. Anemia Panel: No results for input(s): VITAMINB12, FOLATE, FERRITIN, TIBC, IRON, RETICCTPCT in the last 72 hours. Urine analysis:    Component Value Date/Time   COLORURINE AMBER (A) 12/19/2020 0100   APPEARANCEUR CLOUDY (A) 12/19/2020 0100   LABSPEC 1.036 (H) 12/19/2020 0100   PHURINE 5.0 12/19/2020 0100   GLUCOSEU NEGATIVE 12/19/2020 0100   HGBUR SMALL (A) 12/19/2020 0100   BILIRUBINUR NEGATIVE 12/19/2020 0100   KETONESUR NEGATIVE 12/19/2020 0100   PROTEINUR 100 (A) 12/19/2020 0100   NITRITE NEGATIVE 12/19/2020 0100    LEUKOCYTESUR SMALL (A) 12/19/2020 0100    Recent Results (from the past 240 hour(s))  Resp Panel by RT-PCR (Flu A&B, Covid) Nasopharyngeal Swab     Status: Abnormal   Collection Time: 12/19/20 12:30 AM   Specimen: Nasopharyngeal Swab; Nasopharyngeal(NP) swabs in vial transport medium  Result Value Ref Range Status   SARS Coronavirus 2 by RT PCR POSITIVE (A) NEGATIVE Final    Comment: RESULT CALLED TO, READ BACK BY AND VERIFIED WITH: TJ, RN @ 0446 ON 12/19/20 C VARNER (NOTE) SARS-CoV-2 target nucleic acids are DETECTED.  The SARS-CoV-2 RNA is generally detectable in upper respiratory specimens during the acute phase of infection. Positive results are indicative of the presence of the identified virus, but do not rule out bacterial infection or co-infection with other pathogens not detected by the test. Clinical correlation with patient history and other diagnostic information is necessary to determine patient infection status. The expected result is Negative.  Fact Sheet for Patients: BloggerCourse.com  Fact Sheet for Healthcare Providers: SeriousBroker.it  This test is not yet approved or cleared by the  [place] FDA and  has been authorized for detection and/or diagnosis of SARS-CoV-2 by FDA under an TEFL teachermergency Use Authorization (EUA).  This EUA will remain in effect (meaning this test can be  used) for the duration of  the COVID-19 declaration under Section 564(b)(1) of the Act, 21 U.S.C. section 360bbb-3(b)(1), unless the authorization is terminated or revoked sooner.     Influenza A by PCR NEGATIVE NEGATIVE Final   Influenza B by PCR NEGATIVE NEGATIVE Final    Comment: (NOTE) The Xpert Xpress SARS-CoV-2/FLU/RSV plus assay is intended as an aid in the diagnosis of influenza from Nasopharyngeal swab specimens and should not be used as a sole basis for treatment. Nasal washings and aspirates are unacceptable for Xpert  Xpress SARS-CoV-2/FLU/RSV testing.  Fact Sheet for Patients: BloggerCourse.comhttps://www.fda.gov/media/152166/download  Fact Sheet for Healthcare Providers: SeriousBroker.ithttps://www.fda.gov/media/152162/download  This test is not yet approved or cleared by the [place] FDA and has been authorized for detection and/or diagnosis of SARS-CoV-2 by FDA under an Emergency Use Authorization (EUA). This EUA will remain in effect (meaning this test can be used) for the duration of the COVID-19 declaration under Section 564(b)(1) of the Act, 21 U.S.C. section 360bbb-3(b)(1), unless the authorization is terminated or revoked.  Performed at [facility], [address] [place], [place] [address]   Blood Culture (routine x 2)     Status: None (Preliminary result)   Collection Time: 12/19/20 12:54 AM   Specimen: BLOOD RIGHT FOREARM  Result Value Ref Range Status   Specimen Description   Final    BLOOD RIGHT FOREARM Performed at [facility], [address] [place], [place] [address]    Special Requests   Final    BOTTLES DRAWN AEROBIC AND ANAEROBIC Blood Culture results may not be optimal due to an inadequate volume of blood received in culture bottles Performed at [facility], [address] [place], [place] [address]    Culture PENDING  Incomplete   Report Status PENDING  Incomplete     Radiological Exams on Admission: DG Chest Port 1 View  Result Date: 12/18/2020 CLINICAL DATA:  Questionable sepsis. EXAM: PORTABLE CHEST 1 VIEW.  Patient is rotated. COMPARISON:  Chest x-ray 11/21/2020 FINDINGS: The heart size and mediastinal contours are within normal limits. Low lung volumes with streaky airspace opacities at the bases. Question left upper lobe patchy airspace opacity that is poorly visualized on this study. No pulmonary edema. No pleural effusion. No pneumothorax. No acute osseous abnormality. IMPRESSION: Low lung volumes with streaky airspace opacities at the  bases that likely represents atelectasis versus infection/inflammation. Question left upper lobe patchy airspace opacity that is poorly visualized on this study. Limited evaluation due to patient rotation. Recommend repeat PA and lateral view of the chest. Electronically Signed   By: Tish FredericksonMorgane  Naveau M.D.   On: 12/18/2020 23:59   EKG: Independently reviewed. Sinus tachycardia with nonspecific ST changes, no elevation.  Assessment/Plan Active Problems:   Sepsis (HCC)   Dislodged suprapubic catheter, neurogenic bladder:  - IR and urology consulted for assistance with reinsertion. CT pelvis pending for localization. Urology recommends upsizing to 16Fr with plans for exchange in 4 weeks..   Sepsis: Tachycardia + fever with unclear source, though possibilities include pneumonia (hx aspiration/dysphagia), covid-19 and UTI (recurrent UTIs).  - Continue abx, narrow a bit to ceftriaxone, flagyl pending culture data. If workup inconclusive, pt remains afebrile, would consider DC'ing abx.  - Check PCT - Symptom control with morphine, antiemetic prn  Covid-19 infection: +PCR 4/5. Pt's father recently febrile without focal symptoms that have resolved. No respiratory symptoms at this time. Pt had moderna x2 in March/April 2021.  - Mother/caretaker calls diagnosis into question. At this point the clinical significance of this positive result is unclear. CRP only 2.5. Will repeat CXR if respiratory symptoms develop, can trend CRP, PCT in AM. Will defer initiation of treatments at this time.   Hematuria: Suspect due to method of collection/trauma, though also has Ca oxalate crystals. No flank pain.  - Monitor.  Advanced MS:  - Continue antispasmodic - Turn q2h - Continue hospice care at discharge. The goals of care for this hospitalization include antibiotics and any procedure required to replace suprapubic catheter.  DVT prophylaxis: Lovenox (despite hospice status, d-dimer is modestly elevated and pt is  bedbound with covid without anemia/thrombocytopenia)   Code Status: DNR confirmed   Family Communication: Mother at bedside Disposition Plan: Anticipate return home with hospice care Consults called: Urology, spoke with Dr. Arita Miss. IR. Palliative care, spoke with Dr. Neale Burly who will reach out to patient's hospice agency to confirm they will follow.  Admission status: Observation    Tyrone Nine, MD [facility] [email or web address] 12/19/2020, 7:18 AM

## 2020-12-20 DIAGNOSIS — R319 Hematuria, unspecified: Secondary | ICD-10-CM | POA: Diagnosis present

## 2020-12-20 DIAGNOSIS — E7800 Pure hypercholesterolemia, unspecified: Secondary | ICD-10-CM | POA: Diagnosis not present

## 2020-12-20 DIAGNOSIS — Z66 Do not resuscitate: Secondary | ICD-10-CM | POA: Diagnosis present

## 2020-12-20 DIAGNOSIS — N319 Neuromuscular dysfunction of bladder, unspecified: Secondary | ICD-10-CM | POA: Diagnosis present

## 2020-12-20 DIAGNOSIS — L89159 Pressure ulcer of sacral region, unspecified stage: Secondary | ICD-10-CM | POA: Diagnosis not present

## 2020-12-20 DIAGNOSIS — Z79899 Other long term (current) drug therapy: Secondary | ICD-10-CM | POA: Diagnosis not present

## 2020-12-20 DIAGNOSIS — Y846 Urinary catheterization as the cause of abnormal reaction of the patient, or of later complication, without mention of misadventure at the time of the procedure: Secondary | ICD-10-CM | POA: Diagnosis present

## 2020-12-20 DIAGNOSIS — T83090A Other mechanical complication of cystostomy catheter, initial encounter: Secondary | ICD-10-CM

## 2020-12-20 DIAGNOSIS — Z7401 Bed confinement status: Secondary | ICD-10-CM | POA: Diagnosis not present

## 2020-12-20 DIAGNOSIS — A419 Sepsis, unspecified organism: Secondary | ICD-10-CM | POA: Diagnosis present

## 2020-12-20 DIAGNOSIS — T83020A Displacement of cystostomy catheter, initial encounter: Secondary | ICD-10-CM | POA: Diagnosis present

## 2020-12-20 DIAGNOSIS — G35 Multiple sclerosis: Secondary | ICD-10-CM | POA: Diagnosis present

## 2020-12-20 DIAGNOSIS — Y738 Miscellaneous gastroenterology and urology devices associated with adverse incidents, not elsewhere classified: Secondary | ICD-10-CM | POA: Diagnosis present

## 2020-12-20 DIAGNOSIS — N390 Urinary tract infection, site not specified: Secondary | ICD-10-CM | POA: Diagnosis present

## 2020-12-20 DIAGNOSIS — L89312 Pressure ulcer of right buttock, stage 2: Secondary | ICD-10-CM | POA: Diagnosis present

## 2020-12-20 DIAGNOSIS — I10 Essential (primary) hypertension: Secondary | ICD-10-CM

## 2020-12-20 DIAGNOSIS — Z8249 Family history of ischemic heart disease and other diseases of the circulatory system: Secondary | ICD-10-CM | POA: Diagnosis not present

## 2020-12-20 DIAGNOSIS — U071 COVID-19: Secondary | ICD-10-CM | POA: Diagnosis present

## 2020-12-20 DIAGNOSIS — E785 Hyperlipidemia, unspecified: Secondary | ICD-10-CM | POA: Diagnosis present

## 2020-12-20 LAB — URINE CULTURE: Culture: NO GROWTH

## 2020-12-20 NOTE — Plan of Care (Signed)
  Problem: Pain Managment: Goal: General experience of comfort will improve Outcome: Progressing   Problem: Safety: Goal: Ability to remain free from injury will improve Outcome: Progressing   

## 2020-12-20 NOTE — Progress Notes (Signed)
PROGRESS NOTE    Dennis Mann  GEX:[id number] DOB: 1976/08/09 DOA: 12/18/2020 PCP: Crist Fat, MD   Brief Narrative:  Dennis Mann is a 45 y.o. 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 A fever was also noted at home. There has been no recent cough, shortness of breath. No reports of chest pain, vomiting or diarrhea.  Patient admitted as above, noted to meet SIRS criteria without clear infectious etiology.  Remains afebrile but urinalysis at onset looks suspect, formal culture is pending.  Continue broad-spectrum antibiotics.  Assessment & Plan:   Active Problems:   Hypertension   Hyperlipidemia   Multiple sclerosis (HCC)   Sepsis (HCC)   Mechanical complication of suprapubic catheter (HCC)   Pressure injury of skin  Dislodged suprapubic catheter, chronic neurogenic bladder:  - IR and urology consulted for assistance with reinsertion.  -Outpatient follow-up for upsizing to 16Fr with plans for exchange in 4 weeks per previous documentation  SIRS criteria without clear source, -Patient has chronic tachycardia, reported fever outpatient with unclear source although suspect UTI -Patient has chronically elevated heart rate, tachycardic around 1 20-1 30 at baseline per mother, as such SIRS criteria suspected only -Patient is incidentally Covid positive without any overt findings or concern for active infection, urine culture pending luminary negative but UA worrisome in the setting of chronic indwelling Foley catheter. -Continue 3 days of ceftriaxone, discontinue other antibiotics, no indication for acute Covid treatment  COVID-19 positive, cannot rule out acute versus resolving infection  -Positive PCR on 12/19/2020, recent unspecified febrile  illness in the family but again no other Covid positive contacts to date  -Patient is bedbound with no travel, vaccinated times 11/06/2019  -No current indication for treatment  Advanced MS:  - Continue antispasmodic - Turn q2h - Continue hospice care at discharge. The goals of care for this hospitalization include antibiotics and any procedure required to replace suprapubic catheter.  DVT prophylaxis: Lovenox Code Status: DNR confirmed   Family Communication: Mother/father at bedside Disposition Plan: Anticipate return home with hospice care Consults called:  Urology - Dr. Arita Miss.  IR.  Palliative care, spoke with Dr. Neale Burly who will reach out to patient's hospice agency to confirm they will follow.  DVT prophylaxis: Lovenox Code Status: DNR Family Communication: Mother and father at bedside  Status is: Observation  Dispo: The patient is from: Home              Anticipated d/c is to: Home              Anticipated d/c date is: 24 hours              Patient currently not medically stable for discharge given need for ongoing IV antibiotics and further work-up  Procedures:   None  Antimicrobials:  Ceftriaxone x3 days Flagyl discontinued  Subjective: No acute issues or events overnight review of systems limited given patient's mental status baseline  Objective: Vitals:   12/19/20 1900 12/19/20 2219 12/20/20 0500 12/20/20 0529  BP:  (!) 134/104 (!) 134/99   Pulse:  (!) 106 (!) 103   Resp: 20 18    Temp:  98.5 F (36.9 C) 98.7 F (37.1 C) 99.4 F (37.4 C)  TempSrc:  Oral Oral Axillary  SpO2:  96%    Weight:  Height:        Intake/Output Summary (Last 24 hours) at 12/20/2020 1356 Last data filed at 12/20/2020 1324 Gross per 24 hour  Intake 1847.63 ml  Output 1650 ml  Net 197.63 ml   Filed Weights   12/18/20 2241  Weight: 75 kg    Examination:  General exam: Appears calm and comfortable  Respiratory system: Clear to auscultation. Respiratory effort  normal. Cardiovascular system: S1 & S2 heard, RRR. No JVD, murmurs, rubs, gallops or clicks. No pedal edema. Gastrointestinal system: Abdomen is nondistended, soft and nontender. No organomegaly or masses felt. Normal bowel sounds heard. Central nervous system: Alert and oriented. No focal neurological deficits. Extremities: Symmetric 5 x 5 power. Skin: No rashes, lesions or ulcers Psychiatry: Judgement and insight appear normal. Mood & affect appropriate.     Data Reviewed: I have personally reviewed following labs and imaging studies  CBC: Recent Labs  Lab 12/18/20 0054  WBC 8.1  NEUTROABS 5.6  HGB 14.4  HCT 44.4  MCV 84.1  PLT 204   Basic Metabolic Panel: Recent Labs  Lab 12/18/20 0054  NA 137  K 3.6  CL 102  CO2 25  GLUCOSE 100*  BUN 15  CREATININE 0.84  CALCIUM 9.6   GFR: Estimated Creatinine Clearance: 109.9 mL/min (by C-G formula based on SCr of 0.84 mg/dL). Liver Function Tests: Recent Labs  Lab 12/18/20 0054  AST 10*  ALT 16  ALKPHOS 127*  BILITOT 0.9  PROT 7.8  ALBUMIN 4.2   No results for input(s): LIPASE, AMYLASE in the last 168 hours. No results for input(s): AMMONIA in the last 168 hours. Coagulation Profile: Recent Labs  Lab 12/18/20 0054  INR 1.1   Cardiac Enzymes: No results for input(s): CKTOTAL, CKMB, CKMBINDEX, TROPONINI in the last 168 hours. BNP (last 3 results) No results for input(s): PROBNP in the last 8760 hours. HbA1C: No results for input(s): HGBA1C in the last 72 hours. CBG: No results for input(s): GLUCAP in the last 168 hours. Lipid Profile: No results for input(s): CHOL, HDL, LDLCALC, TRIG, CHOLHDL, LDLDIRECT in the last 72 hours. Thyroid Function Tests: No results for input(s): TSH, T4TOTAL, FREET4, T3FREE, THYROIDAB in the last 72 hours. Anemia Panel: Recent Labs    12/19/20 0946  FERRITIN 184   Sepsis Labs: Recent Labs  Lab 12/19/20 0054 12/19/20 0946  PROCALCITON  --  0.18  LATICACIDVEN 0.8  --      Recent Results (from the past 240 hour(s))  Resp Panel by RT-PCR (Flu A&B, Covid) Nasopharyngeal Swab     Status: Abnormal   Collection Time: 12/19/20 12:30 AM   Specimen: Nasopharyngeal Swab; Nasopharyngeal(NP) swabs in vial transport medium  Result Value Ref Range Status   SARS Coronavirus 2 by RT PCR POSITIVE (A) NEGATIVE Final    Comment: RESULT CALLED TO, READ BACK BY AND VERIFIED WITH: TJ, RN @ 0446 ON 12/19/20 C VARNER (NOTE) SARS-CoV-2 target nucleic acids are DETECTED.  The SARS-CoV-2 RNA is generally detectable in upper respiratory specimens during the acute phase of infection. Positive results are indicative of the presence of the identified virus, but do not rule out bacterial infection or co-infection with other pathogens not detected by the test. Clinical correlation with patient history and other diagnostic information is necessary to determine patient infection status. The expected result is Negative.  Fact Sheet for Patients: BloggerCourse.com  Fact Sheet for Healthcare Providers: SeriousBroker.it  This test is not yet approved or cleared by the [place] and  has been authorized for detection and/or diagnosis of SARS-CoV-2 by FDA under an Emergency Use Authorization (EUA).  This EUA will remain in effect (meaning this test can be  used) for the duration of  the COVID-19 declaration under Section 564(b)(1) of the Act, 21 U.S.C. section 360bbb-3(b)(1), unless the authorization is terminated or revoked sooner.     Influenza A by PCR NEGATIVE NEGATIVE Final   Influenza B by PCR NEGATIVE NEGATIVE Final    Comment: (NOTE) The Xpert Xpress SARS-CoV-2/FLU/RSV plus assay is intended as an aid in the diagnosis of influenza from Nasopharyngeal swab specimens and should not be used as a sole basis for treatment. Nasal washings and aspirates are unacceptable for Xpert Xpress SARS-CoV-2/FLU/RSV testing.  Fact  Sheet for Patients: BloggerCourse.com  Fact Sheet for Healthcare Providers: SeriousBroker.it  This test is not yet approved or cleared by the [place] FDA and has been authorized for detection and/or diagnosis of SARS-CoV-2 by FDA under an Emergency Use Authorization (EUA). This EUA will remain in effect (meaning this test can be used) for the duration of the COVID-19 declaration under Section 564(b)(1) of the Act, 21 U.S.C. section 360bbb-3(b)(1), unless the authorization is terminated or revoked.  Performed at [facility], [address] [place], [place] [address]   Blood Culture (routine x 2)     Status: None (Preliminary result)   Collection Time: 12/19/20 12:54 AM   Specimen: BLOOD RIGHT FOREARM  Result Value Ref Range Status   Specimen Description   Final    BLOOD RIGHT FOREARM Performed at [facility], [address], [place], [place] [address]    Special Requests   Final    BOTTLES DRAWN AEROBIC AND ANAEROBIC Blood Culture results may not be optimal due to an inadequate volume of blood received in culture bottles Performed at [facility], [address] [place], [place] [address]    Culture   Final    NO GROWTH < 12 HOURS Performed at [facility], [address] [place], [place] [address]    Report Status PENDING  Incomplete  Urine culture     Status: None   Collection Time: 12/19/20  1:00 AM   Specimen: In/Out Cath Urine  Result Value Ref Range Status   Specimen Description   Final    IN/OUT CATH URINE Performed at [facility], [address] [place], [place] [address]    Special Requests   Final    NONE Performed at [facility], [address] [place], [place] [address]    Culture   Final    NO GROWTH Performed at [facility], [address] [place] [address], [place], [place] [address]    Report Status 12/20/2020  FINAL  Final  Blood Culture (routine x 2)     Status: None (Preliminary result)   Collection Time: 12/19/20  1:18 AM   Specimen: BLOOD  Result Value Ref Range Status   Specimen Description   Final    BLOOD RIGHT ANTECUBITAL Performed at [facility], [address] [place], [place] [address]    Special Requests   Final    BOTTLES DRAWN AEROBIC AND ANAEROBIC Blood Culture adequate volume Performed at [facility], [address] [place], [place] [address]    Culture   Final    NO GROWTH < 12 HOURS Performed at [facility], [address] [place], [place] [address]  Report Status PENDING  Incomplete         Radiology Studies: CT PELVIS WO CONTRAST  Result Date: 12/19/2020 CLINICAL DATA:  History of multiple sclerosis, urinary retention and status post suprapubic bladder drainage catheter placement on 11/21/2020. The patient has been admitted with COVID infection and there is concern clinically that the tube has partially retracted at the skin exit site. The suprapubic tube is currently stool draining urine. EXAM: CT PELVIS WITHOUT CONTRAST TECHNIQUE: Multidetector CT imaging of the pelvis was performed following the standard protocol without intravenous contrast. COMPARISON:  11/21/2020 FINDINGS: Urinary Tract: The suprapubic catheter remains well positioned within the bladder lumen including all of the side holes and pigtail portion of the catheter. The bladder is nearly decompressed. No evidence of pelvic urinoma or fluid collection along the course of the catheter. Bowel: Potential component of ileus of the colon. No evidence of inflammatory process or free air. Vascular/Lymphatic: No pathologically enlarged lymph nodes. No significant vascular abnormality seen. Reproductive:  Unremarkable appearance of the prostate gland. Other: No hernias or fluid collections. No free fluid in the dependent pelvis. Musculoskeletal: No suspicious bone  lesions identified. IMPRESSION: 1. Well positioned suprapubic catheter within the bladder lumen. No evidence of pelvic urinoma or fluid collection along the course of the catheter. 2. Potential component of ileus of the colon. Electronically Signed   By: Irish Lack M.D.   On: 12/19/2020 13:02   DG Chest Port 1 View  Result Date: 12/18/2020 CLINICAL DATA:  Questionable sepsis. EXAM: PORTABLE CHEST 1 VIEW.  Patient is rotated. COMPARISON:  Chest x-ray 11/21/2020 FINDINGS: The heart size and mediastinal contours are within normal limits. Low lung volumes with streaky airspace opacities at the bases. Question left upper lobe patchy airspace opacity that is poorly visualized on this study. No pulmonary edema. No pleural effusion. No pneumothorax. No acute osseous abnormality. IMPRESSION: Low lung volumes with streaky airspace opacities at the bases that likely represents atelectasis versus infection/inflammation. Question left upper lobe patchy airspace opacity that is poorly visualized on this study. Limited evaluation due to patient rotation. Recommend repeat PA and lateral view of the chest. Electronically Signed   By: Tish Frederickson M.D.   On: 12/18/2020 23:59        Scheduled Meds: . baclofen  10 mg Oral TID   And  . baclofen  10 mg Oral QHS  . enoxaparin (LOVENOX) injection  40 mg Subcutaneous Q24H  . polyethylene glycol  17 g Oral Daily  . senna-docusate  1 tablet Oral Daily   Continuous Infusions: . cefTRIAXone (ROCEPHIN)  IV 1 g (12/20/20 1220)  . lactated ringers       LOS: 0 days   Time spent:  Azucena Fallen, DO [facility]  If 7PM-7AM, please contact night-coverage [email or web address]  12/20/2020, 1:56 PM

## 2020-12-21 DIAGNOSIS — N390 Urinary tract infection, site not specified: Secondary | ICD-10-CM

## 2020-12-21 DIAGNOSIS — A419 Sepsis, unspecified organism: Principal | ICD-10-CM

## 2020-12-21 MED ORDER — SODIUM CHLORIDE 0.9 % IV SOLN
1.0000 g | INTRAVENOUS | Status: DC
  Administered 2020-12-21: 1 g via INTRAVENOUS
  Filled 2020-12-21: qty 1

## 2020-12-21 NOTE — TOC Progression Note (Signed)
Transition of Care [facility]) - Progression Note    Patient Details  Name: Dennis Mann MRN: [id number] Date of Birth: 10-26-1975  [facility] CM/SW Contact  Geni Bers, RN Phone Number: 12/21/2020, 12:21 PM  Clinical Narrative:     Spoke with pt and mother at bedside. Pt will go home by PTAR. Transportation was called, pt, mother at bedside and RN aware.   Expected Discharge Plan: Home/Self Care Barriers to Discharge: No Barriers Identified  Expected Discharge Plan and Services Expected Discharge Plan: Home/Self Care       Living arrangements for the past 2 months: Single Family Home Expected Discharge Date: 12/21/20                                     Social Determinants of Health (SDOH) Interventions    Readmission Risk Interventions No flowsheet data found.

## 2020-12-21 NOTE — Progress Notes (Signed)
MD gave order for discharge, clarified no home antibiotics to be given, also suprapubic exchange has been cancelled on Monday, mother at bedside updated, IV removed, transportation arranged.

## 2020-12-21 NOTE — Discharge Summary (Signed)
Physician Discharge Summary  Kanav Kazmierczak WUX:[id number] DOB: December 12, 1975 DOA: 12/18/2020  PCP: Crist Fat, MD  Admit date: 12/18/2020 Discharge date: 12/21/2020  Admitted From: Home Disposition: Home  Recommendations for Outpatient Follow-up:  1. Follow up with PCP in 1-2 weeks 2. Please obtain BMP/CBC in one week 3. 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 A fever was also noted at home. There has been no recent cough, shortness of breath. No reports of chest pain, vomiting or diarrhea.  Patient admitted as above, noted to meet SIRS criteria without clear infectious etiology.  Remains afebrile but urinalysis at onset looks suspect, formal culture is pending. Admitted for broad-spectrum antibiotics.  Patient admitted as above initially meeting SIRS criteria in the setting of neurogenic bladder chronic Foley placement and likely UTI.  Patient was positive for COVID-19 but given lack of symptoms, labs and evaluation patient is likely either an asymptomatic carrier or has resolved from previous infection but remains positive testing only.  Regardless patient was treated with IV antibiotics in the setting of likely UTI, improved drastically, no further fever or white count.  Lengthy discussion with mother and father at bedside daily about patient's ongoing care.  Plan for outpatient suprapubic catheter replacement with interventional radiology currently on hold given patient's need  for quarantine per protocol given Covid positive status.  Again patient remains asymptomatic from the standpoint but will continue precautions and quarantine per CDC guidelines.  Discussed with mother at length who requested "prophylactic antibiotics at discharge" and we discussed that this would only increase patient's risks of harboring or growing a more resistant bacterial infection which would only increase his risks of mobility mortality in the near future.  Patient otherwise feels well and family agree patient's otherwise stable for discharge.  Awaiting [facility] for pickup to bring him home.  Discharge Diagnoses:  Active Problems:   Hypertension   Hyperlipidemia   Multiple sclerosis (HCC)   Sepsis (HCC)   Mechanical complication of suprapubic catheter (HCC)   Pressure injury of skin   Sepsis secondary to UTI [facility])    Discharge Instructions  Discharge Instructions    Call MD for:  difficulty breathing, headache or visual disturbances   Complete by: As directed    Call MD for:  temperature >100.4   Complete by: As directed    Diet - low sodium heart healthy   Complete by: As directed    Increase activity slowly   Complete by: As directed    No dressing needed   Complete by: As directed      Allergies as of 12/21/2020   No Known Allergies     Medication List    TAKE these medications   baclofen 10 MG tablet Commonly known as: LIORESAL Take 10 mg in the morning, 10 mg in the afternoon and 20 mg at night What changed:   how much to take  how to take this  when to take this   Dulcolax 10 MG suppository Generic drug: bisacodyl Place 10 mg rectally daily as needed for constipation. No bm in 3 days  methenamine 1 g tablet Commonly known as: HIPREX Take 1 g by mouth 2 (two) times daily.   morphine CONCENTRATE 10 mg / 0.5 ml concentrated solution Take 0.25-0.5 mLs by mouth every 2 (two) hours as needed for pain or shortness of breath.   ondansetron 4 MG  tablet Commonly known as: ZOFRAN Take 4 mg by mouth every 4 (four) hours as needed for nausea/vomiting.            Discharge Care Instructions  (From admission, onward)         Start     Ordered   12/21/20 0000  No dressing needed        12/21/20 0758          No Known Allergies  Consultations:  Invention radiology, urology   Procedures/Studies: CT PELVIS WO CONTRAST  Result Date: 12/19/2020 CLINICAL DATA:  History of multiple sclerosis, urinary retention and status post suprapubic bladder drainage catheter placement on 11/21/2020. The patient has been admitted with COVID infection and there is concern clinically that the tube has partially retracted at the skin exit site. The suprapubic tube is currently stool draining urine. EXAM: CT PELVIS WITHOUT CONTRAST TECHNIQUE: Multidetector CT imaging of the pelvis was performed following the standard protocol without intravenous contrast. COMPARISON:  11/21/2020 FINDINGS: Urinary Tract: The suprapubic catheter remains well positioned within the bladder lumen including all of the side holes and pigtail portion of the catheter. The bladder is nearly decompressed. No evidence of pelvic urinoma or fluid collection along the course of the catheter. Bowel: Potential component of ileus of the colon. No evidence of inflammatory process or free air. Vascular/Lymphatic: No pathologically enlarged lymph nodes. No significant vascular abnormality seen. Reproductive:  Unremarkable appearance of the prostate gland. Other: No hernias or fluid collections. No free fluid in the dependent pelvis. Musculoskeletal: No suspicious bone lesions identified. IMPRESSION: 1. Well positioned suprapubic catheter within the bladder lumen. No evidence of pelvic urinoma or fluid collection along the course of the catheter. 2. Potential component of ileus of the colon. Electronically Signed   By: Irish Lack M.D.   On: 12/19/2020 13:02   DG Chest Port 1 View  Result  Date: 12/18/2020 CLINICAL DATA:  Questionable sepsis. EXAM: PORTABLE CHEST 1 VIEW.  Patient is rotated. COMPARISON:  Chest x-ray 11/21/2020 FINDINGS: The heart size and mediastinal contours are within normal limits. Low lung volumes with streaky airspace opacities at the bases. Question left upper lobe patchy airspace opacity that is poorly visualized on this study. No pulmonary edema. No pleural effusion. No pneumothorax. No acute osseous abnormality. IMPRESSION: Low lung volumes with streaky airspace opacities at the bases that likely represents atelectasis versus infection/inflammation. Question left upper lobe patchy airspace opacity that is poorly visualized on this study. Limited evaluation due to patient rotation. Recommend repeat PA and lateral view of the chest. Electronically Signed   By: Tish Frederickson M.D.   On: 12/18/2020 23:59   CT IMAGE GUIDED DRAINAGE BY PERCUTANEOUS CATHETER  Result Date: 11/21/2020 INDICATION: 45 year old male with end-stage multiple sclerosis and severe urinary retention with suspected sepsis. Unable to achieve Foley catheter placement despite multiple attempts and cystoscopy in the emergency department. Patient presents for suprapubic catheter placement. EXAM: CT IMAGE GUIDED DRAINAGE BY PERCUTANEOUS CATHETER COMPARISON:  None. MEDICATIONS: None ANESTHESIA/SEDATION: Fentanyl 2 mcg IV; Versed 50 mg IV Moderate Sedation Time:  19 minutes The patient was continuously monitored during the procedure by the interventional radiology nurse under my direct supervision. CONTRAST:  None.  FLUOROSCOPY TIME:  None. COMPLICATIONS: None immediate. PROCEDURE: Informed written consent was obtained from the patient after a thorough discussion of the procedural risks, benefits and alternatives. All questions were addressed. Maximal Sterile Barrier Technique was utilized including caps, mask, sterile gowns, sterile gloves, sterile drape, hand hygiene and skin antiseptic. A timeout was performed  prior to the initiation of the procedure. A planning axial CT scan was performed. The distended bladder is easily visualized. A suitable avascular plane was targeted in the low midline abdomen. The overlying skin was sterilely prepped and draped in the standard fashion using chlorhexidine skin prep. Local anesthesia was attained by infiltration with 1% lidocaine. A small dermatotomy was made. Under intermittent CT guidance, an 18 gauge trocar needle was advanced into the bladder. A 0.035 wire was coiled in the bladder. The introducer needle was then removed and the skin tract was serially dilated to 14 [place]. A Cook 14 [place] all-purpose drainage catheter was then advanced over the wire and formed in the bladder. Post placement CT imaging demonstrates a well-positioned suprapubic catheter. The catheter was connected to a Foley bag and secured to the skin with 0 Prolene suture. IMPRESSION: Successful placement of 14 French suprapubic catheter. PLAN: Return to interventional Radiology in 4-6 weeks for catheter exchange and up size. Electronically Signed   By: Malachy Moan M.D.   On: 11/21/2020 14:57      Subjective: No acute issues or events overnight   Discharge Exam: Vitals:   12/20/20 2020 12/21/20 0521  BP: 135/89 (!) 141/98  Pulse: 90 100  Resp: 16 16  Temp: 98.2 F (36.8 C) 99.3 F (37.4 C)  SpO2: 94% 93%   Vitals:   12/20/20 1538 12/20/20 1539 12/20/20 2020 12/21/20 0521  BP: (!) 133/93 (!) 133/93 135/89 (!) 141/98  Pulse: 97 (!) 103 90 100  Resp: 18  16 16   Temp: 98.8 F (37.1 C) 98.8 F (37.1 C) 98.2 F (36.8 C) 99.3 F (37.4 C)  TempSrc: Oral Oral Oral Oral  SpO2: 93% 97% 94% 93%  Weight:      Height:        General: Pt is alert, awake, not in acute distress Cardiovascular: RRR, S1/S2 +, no rubs, no gallops Respiratory: CTA bilaterally, no wheezing, no rhonchi Abdominal: Soft, NT, ND, bowel sounds +; suprapubic Foley noted Extremities: no edema, no  cyanosis    The results of significant diagnostics from this hospitalization (including imaging, microbiology, ancillary and laboratory) are listed below for reference.     Microbiology: Recent Results (from the past 240 hour(s))  Resp Panel by RT-PCR (Flu A&B, Covid) Nasopharyngeal Swab     Status: Abnormal   Collection Time: 12/19/20 12:30 AM   Specimen: Nasopharyngeal Swab; Nasopharyngeal(NP) swabs in vial transport medium  Result Value Ref Range Status   SARS Coronavirus 2 by RT PCR POSITIVE (A) NEGATIVE Final    Comment: RESULT CALLED TO, READ BACK BY AND VERIFIED WITH: TJ, RN @ 0446 ON 12/19/20 C VARNER (NOTE) SARS-CoV-2 target nucleic acids are DETECTED.  The SARS-CoV-2 RNA is generally detectable in upper respiratory specimens during the acute phase of infection. Positive results are indicative of the presence of the identified virus, but do not rule out bacterial infection or co-infection with other pathogens not detected by the test. Clinical correlation with patient history and other diagnostic information is necessary to determine patient infection status. The expected result is Negative.  Fact Sheet for Patients: 02/18/21  Fact Sheet for Healthcare Providers: BloggerCourse.com  This  test is not yet approved or cleared by the [place] and  has been authorized for detection and/or diagnosis of SARS-CoV-2 by FDA under an Emergency Use Authorization (EUA).  This EUA will remain in effect (meaning this test can be  used) for the duration of  the COVID-19 declaration under Section 564(b)(1) of the Act, 21 U.S.C. section 360bbb-3(b)(1), unless the authorization is terminated or revoked sooner.     Influenza A by PCR NEGATIVE NEGATIVE Final   Influenza B by PCR NEGATIVE NEGATIVE Final    Comment: (NOTE) The Xpert Xpress SARS-CoV-2/FLU/RSV plus assay is intended as an aid in the diagnosis of influenza  from Nasopharyngeal swab specimens and should not be used as a sole basis for treatment. Nasal washings and aspirates are unacceptable for Xpert Xpress SARS-CoV-2/FLU/RSV testing.  Fact Sheet for Patients: BloggerCourse.com  Fact Sheet for Healthcare Providers: SeriousBroker.it  This test is not yet approved or cleared by the [place] FDA and has been authorized for detection and/or diagnosis of SARS-CoV-2 by FDA under an Emergency Use Authorization (EUA). This EUA will remain in effect (meaning this test can be used) for the duration of the COVID-19 declaration under Section 564(b)(1) of the Act, 21 U.S.C. section 360bbb-3(b)(1), unless the authorization is terminated or revoked.  Performed at [facility], [address], [place], [place] [address]   Blood Culture (routine x 2)     Status: None (Preliminary result)   Collection Time: 12/19/20 12:54 AM   Specimen: BLOOD RIGHT FOREARM  Result Value Ref Range Status   Specimen Description   Final    BLOOD RIGHT FOREARM Performed at [facility], [address], [place], [place] [address]    Special Requests   Final    BOTTLES DRAWN AEROBIC AND ANAEROBIC Blood Culture results may not be optimal due to an inadequate volume of blood received in culture bottles Performed at [facility], [address], [place], [place] [address]    Culture   Final    NO GROWTH 1 DAY Performed at [facility], [address], [place], [place] [address]    Report Status PENDING  Incomplete  Urine culture     Status: None   Collection Time: 12/19/20  1:00 AM   Specimen: In/Out Cath Urine  Result Value Ref Range Status   Specimen Description   Final    IN/OUT CATH URINE Performed at [facility], [address] [place], [place] [address]    Special Requests   Final    NONE Performed at [facility],  [address], [place], [place] [address]    Culture   Final    NO GROWTH Performed at [facility], [address], [place], [place] [address]    Report Status 12/20/2020 FINAL  Final  Blood Culture (routine x 2)     Status: None (Preliminary result)   Collection Time: 12/19/20  1:18 AM   Specimen: BLOOD  Result Value Ref Range Status   Specimen Description   Final    BLOOD RIGHT ANTECUBITAL Performed at [facility], [address], [place], [place] [address]    Special Requests   Final    BOTTLES DRAWN AEROBIC AND ANAEROBIC Blood Culture adequate volume Performed at [facility], [address], [place], [place] [address]    Culture   Final    NO GROWTH 1 DAY Performed at [facility], [address], [place], [place] [address]    Report Status PENDING  Incomplete     Labs: BNP (last 3 results) No results for input(s): BNP in the last 8760 hours. Basic Metabolic Panel: Recent Labs  Lab 12/18/20 0054  NA 137  K 3.6  CL 102  CO2 25  GLUCOSE 100*  BUN 15  CREATININE 0.84  CALCIUM 9.6   Liver Function Tests: Recent Labs  Lab 12/18/20 0054  AST 10*  ALT 16  ALKPHOS 127*  BILITOT 0.9  PROT 7.8  ALBUMIN 4.2   No results for input(s): LIPASE, AMYLASE in the last 168 hours. No results for input(s): AMMONIA in the last 168 hours. CBC: Recent Labs  Lab 12/18/20 0054  WBC 8.1  NEUTROABS 5.6  HGB 14.4  HCT 44.4  MCV 84.1  PLT 204   Cardiac Enzymes: No results for input(s): CKTOTAL, CKMB, CKMBINDEX, TROPONINI in the last 168 hours. BNP: Invalid input(s): POCBNP CBG: No results for input(s): GLUCAP in the last 168 hours. D-Dimer Recent Labs    12/19/20 0946  DDIMER 0.87*   Hgb A1c No results for input(s): HGBA1C in the last 72 hours. Lipid Profile No results for input(s): CHOL, HDL, LDLCALC, TRIG, CHOLHDL, LDLDIRECT in the last 72 hours. Thyroid function studies No results for input(s): TSH,  T4TOTAL, T3FREE, THYROIDAB in the last 72 hours.  Invalid input(s): FREET3 Anemia work up Recent Labs    12/19/20 0946  FERRITIN 184   Urinalysis    Component Value Date/Time   COLORURINE AMBER (A) 12/19/2020 0100   APPEARANCEUR CLOUDY (A) 12/19/2020 0100   LABSPEC 1.036 (H) 12/19/2020 0100   PHURINE 5.0 12/19/2020 0100   GLUCOSEU NEGATIVE 12/19/2020 0100   HGBUR SMALL (A) 12/19/2020 0100   BILIRUBINUR NEGATIVE 12/19/2020 0100   KETONESUR NEGATIVE 12/19/2020 0100   PROTEINUR 100 (A) 12/19/2020 0100   NITRITE NEGATIVE 12/19/2020 0100   LEUKOCYTESUR SMALL (A) 12/19/2020 0100   Sepsis Labs Invalid input(s): PROCALCITONIN,  WBC,  LACTICIDVEN Microbiology Recent Results (from the past 240 hour(s))  Resp Panel by RT-PCR (Flu A&B, Covid) Nasopharyngeal Swab     Status: Abnormal   Collection Time: 12/19/20 12:30 AM   Specimen: Nasopharyngeal Swab; Nasopharyngeal(NP) swabs in vial transport medium  Result Value Ref Range Status   SARS Coronavirus 2 by RT PCR POSITIVE (A) NEGATIVE Final    Comment: RESULT CALLED TO, READ BACK BY AND VERIFIED WITH: TJ, RN @ 0446 ON 12/19/20 C VARNER (NOTE) SARS-CoV-2 target nucleic acids are DETECTED.  The SARS-CoV-2 RNA is generally detectable in upper respiratory specimens during the acute phase of infection. Positive results are indicative of the presence of the identified virus, but do not rule out bacterial infection or co-infection with other pathogens not detected by the test. Clinical correlation with patient history and other diagnostic information is necessary to determine patient infection status. The expected result is Negative.  Fact Sheet for Patients: BloggerCourse.com  Fact Sheet for Healthcare Providers: SeriousBroker.it  This test is not yet approved or cleared by the [place] FDA and  has been authorized for detection and/or diagnosis of SARS-CoV-2 by FDA under an  Emergency Use Authorization (EUA).  This EUA will remain in effect (meaning this test can be  used) for the duration of  the COVID-19 declaration under Section 564(b)(1) of the Act, 21 U.S.C. section 360bbb-3(b)(1), unless the authorization is terminated or revoked sooner.     Influenza A by PCR NEGATIVE NEGATIVE Final  Influenza B by PCR NEGATIVE NEGATIVE Final    Comment: (NOTE) The Xpert Xpress SARS-CoV-2/FLU/RSV plus assay is intended as an aid in the diagnosis of influenza from Nasopharyngeal swab specimens and should not be used as a sole basis for treatment. Nasal washings and aspirates are unacceptable for Xpert Xpress SARS-CoV-2/FLU/RSV testing.  Fact Sheet for Patients: BloggerCourse.comhttps://www.fda.gov/media/152166/download  Fact Sheet for Healthcare Providers: SeriousBroker.ithttps://www.fda.gov/media/152162/download  This test is not yet approved or cleared by the [place] FDA and has been authorized for detection and/or diagnosis of SARS-CoV-2 by FDA under an Emergency Use Authorization (EUA). This EUA will remain in effect (meaning this test can be used) for the duration of the COVID-19 declaration under Section 564(b)(1) of the Act, 21 U.S.C. section 360bbb-3(b)(1), unless the authorization is terminated or revoked.  Performed at [facility], [address] [place], [place] [address]   Blood Culture (routine x 2)     Status: None (Preliminary result)   Collection Time: 12/19/20 12:54 AM   Specimen: BLOOD RIGHT FOREARM  Result Value Ref Range Status   Specimen Description   Final    BLOOD RIGHT FOREARM Performed at [facility], [address] [place], [place] [address]    Special Requests   Final    BOTTLES DRAWN AEROBIC AND ANAEROBIC Blood Culture results may not be optimal due to an inadequate volume of blood received in culture bottles Performed at [facility], [address] [place], [place] [address]    Culture   Final    NO  GROWTH 1 DAY Performed at [facility], [address] [place], [place] [address]    Report Status PENDING  Incomplete  Urine culture     Status: None   Collection Time: 12/19/20  1:00 AM   Specimen: In/Out Cath Urine  Result Value Ref Range Status   Specimen Description   Final    IN/OUT CATH URINE Performed at [facility], [address] [place], [place] [address]    Special Requests   Final    NONE Performed at [facility], [address] [place], [place] [address]    Culture   Final    NO GROWTH Performed at [facility], [address] [place], [place] [address]    Report Status 12/20/2020 FINAL  Final  Blood Culture (routine x 2)     Status: None (Preliminary result)   Collection Time: 12/19/20  1:18 AM   Specimen: BLOOD  Result Value Ref Range Status   Specimen Description   Final    BLOOD RIGHT ANTECUBITAL Performed at [facility], [address] [place], [place] [address]    Special Requests   Final    BOTTLES DRAWN AEROBIC AND ANAEROBIC Blood Culture adequate volume Performed at [facility], [address] [place], [place] [address]    Culture   Final    NO GROWTH 1 DAY Performed at [facility], [address] [place], [place] [address]    Report Status PENDING  Incomplete     Time coordinating discharge: Over 30 minutes  SIGNED:   Azucena FallenWilliam C Elie Leppo, DO [facility] 12/21/2020, 7:59 AM Pager   If 7PM-7AM, please contact night-coverage [email or web address]

## 2020-12-24 LAB — CULTURE, BLOOD (ROUTINE X 2)
Culture: NO GROWTH
Culture: NO GROWTH
Special Requests: ADEQUATE

## 2020-12-25 ENCOUNTER — Ambulatory Visit (HOSPITAL_COMMUNITY): Payer: Medicaid Other

## 2020-12-25 ENCOUNTER — Inpatient Hospital Stay (HOSPITAL_COMMUNITY): Admission: RE | Admit: 2020-12-25 | Payer: Medicaid Other | Source: Ambulatory Visit

## 2021-01-05 ENCOUNTER — Other Ambulatory Visit: Payer: Self-pay | Admitting: Student

## 2021-01-08 ENCOUNTER — Ambulatory Visit (HOSPITAL_COMMUNITY)
Admission: RE | Admit: 2021-01-08 | Discharge: 2021-01-08 | Disposition: A | Discharge status: Home / Self Care | Payer: Medicaid Other | Source: Ambulatory Visit | Attending: Adult Health | Admitting: Adult Health

## 2021-01-08 ENCOUNTER — Other Ambulatory Visit: Payer: Self-pay

## 2021-01-08 ENCOUNTER — Encounter (HOSPITAL_COMMUNITY): Payer: Self-pay

## 2021-01-08 DIAGNOSIS — R339 Retention of urine, unspecified: Secondary | ICD-10-CM | POA: Insufficient documentation

## 2021-01-08 HISTORY — PX: IR CATHETER TUBE CHANGE: IMG717

## 2021-01-08 MED ORDER — IOHEXOL 300 MG/ML  SOLN
50.0000 mL | Freq: Once | INTRAMUSCULAR | Status: AC | PRN
  Administered 2021-01-08: 15 mL

## 2021-01-08 MED ORDER — SODIUM CHLORIDE 0.9 % IV SOLN: INTRAVENOUS | Status: DC

## 2021-01-08 MED ORDER — FENTANYL CITRATE (PF) 100 MCG/2ML IJ SOLN
INTRAMUSCULAR | Status: AC
  Filled 2021-01-08: qty 2

## 2021-01-08 MED ORDER — MIDAZOLAM HCL 2 MG/2ML IJ SOLN
INTRAMUSCULAR | Status: AC
  Filled 2021-01-08: qty 2

## 2021-01-08 MED ORDER — FENTANYL CITRATE (PF) 100 MCG/2ML IJ SOLN
INTRAMUSCULAR | Status: AC | PRN
  Administered 2021-01-08: 50 ug via INTRAVENOUS

## 2021-01-08 MED ORDER — MIDAZOLAM HCL 2 MG/2ML IJ SOLN
INTRAMUSCULAR | Status: AC | PRN
  Administered 2021-01-08 (×2): 1 mg via INTRAVENOUS

## 2021-01-08 MED ORDER — LIDOCAINE HCL 1 % IJ SOLN
INTRAMUSCULAR | Status: AC
  Filled 2021-01-08: qty 20

## 2021-01-08 NOTE — Discharge Instructions (Signed)
Suprapubic Catheter Replacement  Suprapubic catheter replacement is a procedure to remove an old catheter and insert a new, clean catheter. A suprapubic catheter is a rubber tube that drains urine from the bladder into a collection bag outside the body. The catheter is inserted into the bladder through a small opening in the lower abdomen, near the center of the body, above the pubic bone (suprapubicarea). There is a tiny balloon filled with germ-free (sterile) water on the end of the catheter that is in the bladder. The balloon helps to keep the catheter in place. If you need to wear a catheter for a long period of time, you may be instructed to replace the catheter yourself. Usually, suprapubic catheters need to be replaced every 4-6 weeks, or as often as told by your health care provider. What are the risks? Generally, this is a safe procedure. However, problems may occur, including failure to get the catheter into the bladder. What happens before the procedure?  You may have an exam or testing, including a blood or urine sample.  Ask your health care provider what steps will be taken to help prevent infection. What happens during the procedure?  You will lie on your back.  The water from the balloon will be removed using a syringe.  The catheter will be slowly removed.  Lubricant will be applied to the end of the new catheter that will go into your bladder.  The new catheter will be inserted through the opening in your abdomen. Your health care provider will slide the catheter into your bladder.  Your health care provider will wait for some urine to start flowing through the catheter. When this happens, a syringe will be used to fill the balloon with sterile water.  A collection bag will be attached to the end of the catheter. The procedure may vary among health care providers and hospitals. What can I expect after procedure? After the procedure, it is common to have:  Some  discomfort around the opening in your abdomen. Follow these instructions at home: Caring for the skin around the catheter Use a clean washcloth and soapy water to clean the skin around your catheter every day. Pat the area dry with a clean towel.  Do not pull on the catheter.  Do not use ointment or lotion on this area unless told by your health care provider.  Check the skin around the catheter every day for signs of infection. Check for: ? Redness, swelling, or pain. ? Fluid or blood. ? Warmth. ? Pus or a bad smell. Caring for the catheter  Clean the catheter with soap and water as often as told by your health care provider.  Always make sure there are no twists or curls (kinks) in the catheter.  As soon as you are able to move, you may use a leg bag to collect the urine. ? Make sure that the tubing is straight and without kinks. ? Wrap an ace bandage gently over the tubing and around your leg to minimize the risk of the bag getting pulled out. Emptying the collection bag Empty the large collection bag every 8 hours. Empty the small collection bag when it is about ? full. To empty your large or small collection bag, take the following steps:  Always keep the bag below the level of the catheter. This keeps urine from flowing backward into the catheter.  Hold the bag over the toilet or another container. Turn the valve (spigot) at the bottom of   the bag to empty the urine. ? Do not touch the opening of the spigot. ? Do not let the opening touch the toilet or container.  Close the spigot tightly when the bag is empty. Cleaning the collection bag  Wash your hands with soap and water. If soap and water are not available, use hand sanitizer.  Disconnect the bag from the catheter and immediately attach a new bag to the catheter.  Empty the used bag completely.  Clean the used bag according to the manufacturer's instructions, or as told by your health care provider.  Let the bag  dry completely, and put it in a clean plastic bag before storing it.   General instructions  Always wash your hands before and after caring for your catheter and collection bag. Use soap and water. If soap and water are not available, use hand sanitizer.  Always make sure there are no leaks in the catheter or collection bag.  Drink enough fluid to keep your urine pale yellow.  If you were prescribed an antibiotic medicine, take it as told by your health care provider. Do not stop taking the antibiotic even if you start to feel better.  Do not take baths, swim, or use a hot tub.  Keep all follow-up visits as told by your health care provider. This is important.   Contact a health care provider if:  You leak urine.  You have redness, swelling, or pain around your catheter opening.  You have fluid or blood coming from your catheter opening.  Your catheter opening feels warm to the touch.  You have pus or a bad smell coming from your catheter opening.  You have a fever or chills.  Your urine flow slows down.  Your urine becomes cloudy or smelly. Get help right away if:  Your catheter comes out.  You feel nauseous.  You have back pain.  You have difficulty changing your catheter.  You have blood in your urine.  You have no urine flow for 1 hour. Summary  Suprapubic catheter replacement is a procedure to remove an old catheter and insert a new, clean catheter.  Make sure that you understand how to care for your catheter, your collection bag, and the opening in your abdomen.  Always wash your hands before and after caring for your catheter and collection bag.  Contact a health care provider if you leak urine or have a fever or any signs of infection around the catheter opening, or if your urine becomes cloudy or smelly.  Get help right away if your catheter comes out or you have nausea, back pain, blood in your urine, no urine flow for 1 hour, or difficulty changing  your catheter. This information is not intended to replace advice given to you by your health care provider. Make sure you discuss any questions you have with your health care provider. Document Revised: 04/22/2018 Document Reviewed: 04/22/2018 Elsevier Patient Education  2021 [employer]  

## 2021-01-08 NOTE — H&P (Signed)
Chief Complaint: Patient was seen in consultation today for image guided exchange and upsize of suprapubic catheter at the request of Davis,Jennaya L  Referring Physician(s): Davis,Jennaya L  Supervising Physician: Ruel Favors  Patient Status: [facility] [address] - Out-pt  History of Present Illness: Dennis Mann is a 45 y.o. male  with history of end-stage multiple sclerosis and neurogenic bladder s/p suprapubic catheter placement with Dr. Archer Asa on 11/21/20.  Patient presents with his mother  to [facility] IR today for routine exchange and upsize of the suprapubic cathter.   Patient not able to answer question properly due to history of MS, all questions were answered by his mother.  Patient laying in bed, not in acute distress.  Patient's mother denise patient having fever, chills, shortness of breath, cough, chest pain, abdominal pain, nausea ,vomiting, and bleeding.   Past Medical History:  Diagnosis Date  . Anal fissure   . Cognitive impairment   . Essential hypertension   . Hyperlipidemia   . Multiple sclerosis (HCC)     History reviewed. No pertinent surgical history.  Allergies: Patient has no known allergies.  Medications: Prior to Admission medications   Medication Sig Start Date End Date Taking? Authorizing Provider  ALPRAZOLAM PO Take 2.5 mg by mouth at bedtime as needed for anxiety.   Yes [provider]  baclofen (LIORESAL) 10 MG tablet Take 10 mg in the morning, 10 mg in the afternoon and 20 mg at night Patient taking differently: Take 10 mg by mouth See admin instructions. Take 10 mg in the morning, 10 mg in the afternoon and 20 mg at night 08/17/20  Yes Sater, Pearletha Furl, MD  methenamine (HIPREX) 1 g tablet Take 1 g by mouth 2 (two) times daily. 11/02/20  Yes [provider]  DULCOLAX 10 MG suppository Place 10 mg rectally daily as needed for constipation. No bm in 3 days 11/20/20   [provider]  Morphine Sulfate (MORPHINE CONCENTRATE) 10 mg /  0.5 ml concentrated solution Take 0.25-0.5 mLs by mouth every 2 (two) hours as needed for pain or shortness of breath. 11/20/20   [provider]  ondansetron (ZOFRAN) 4 MG tablet Take 4 mg by mouth every 4 (four) hours as needed for nausea/vomiting. 11/20/20   [provider]     Family History  Problem Relation Age of Onset  . Heart disease Maternal Grandfather   . Diabetes Paternal Grandfather   . Colon cancer Paternal Grandmother     Social History   Socioeconomic History  . Marital status: Single    Spouse name: Not on file  . Number of children: Not on file  . Years of education: Not on file  . Highest education level: Not on file  Occupational History  . Not on file  Tobacco Use  . Smoking status: Never Smoker  . Smokeless tobacco: Never Used  Vaping Use  . Vaping Use: Never used  Substance and Sexual Activity  . Alcohol use: Never  . Drug use: Never  . Sexual activity: Not on file  Other Topics Concern  . Not on file  Social History Narrative   Lives   Caffeine use:    Social Determinants of Health   Financial Resource Strain: Not on file  Food Insecurity: Not on file  Transportation Needs: Not on file  Physical Activity: Not on file  Stress: Not on file  Social Connections: Not on file     Review of Systems: A 12 point ROS discussed and pertinent  positives are indicated in the HPI above.  All other systems are negative.   Vital Signs: BP (!) 137/101 Comment: left arm  Pulse (!) 107   Temp 97.8 F (36.6 C) (Oral)   Resp 20   Ht 5\' 9"  (1.753 m)   Wt 160 lb 15 oz (73 kg)   SpO2 97%   BMI 23.77 kg/m   Physical Exam Vitals and nursing note reviewed.  Constitutional:      General: He is not in acute distress.    Appearance: Normal appearance. He is not ill-appearing.  HENT:     Head: Normocephalic and atraumatic.  Cardiovascular:     Rate and Rhythm: Regular rhythm. Tachycardia present.     Pulses: Normal pulses.     Heart  sounds: Normal heart sounds.  Pulmonary:     Effort: Pulmonary effort is normal.     Breath sounds: Normal breath sounds.  Abdominal:     General: Abdomen is flat.     Palpations: Abdomen is soft.     Comments: Positive for suprapubic catheter with Stat Lock in place.   Skin:    General: Skin is warm and dry.  Neurological:     Mental Status: He is alert. Mental status is at baseline.     MD Evaluation Airway: WNL Heart: WNL Abdomen: WNL Chest/ Lungs: WNL ASA  Classification: 3 Mallampati/Airway Score: Two  Imaging: CT PELVIS WO CONTRAST  Result Date: 12/19/2020 CLINICAL DATA:  History of multiple sclerosis, urinary retention and status post suprapubic bladder drainage catheter placement on 11/21/2020. The patient has been admitted with COVID infection and there is concern clinically that the tube has partially retracted at the skin exit site. The suprapubic tube is currently stool draining urine. EXAM: CT PELVIS WITHOUT CONTRAST TECHNIQUE: Multidetector CT imaging of the pelvis was performed following the standard protocol without intravenous contrast. COMPARISON:  11/21/2020 FINDINGS: Urinary Tract: The suprapubic catheter remains well positioned within the bladder lumen including all of the side holes and pigtail portion of the catheter. The bladder is nearly decompressed. No evidence of pelvic urinoma or fluid collection along the course of the catheter. Bowel: Potential component of ileus of the colon. No evidence of inflammatory process or free air. Vascular/Lymphatic: No pathologically enlarged lymph nodes. No significant vascular abnormality seen. Reproductive:  Unremarkable appearance of the prostate gland. Other: No hernias or fluid collections. No free fluid in the dependent pelvis. Musculoskeletal: No suspicious bone lesions identified. IMPRESSION: 1. Well positioned suprapubic catheter within the bladder lumen. No evidence of pelvic urinoma or fluid collection along the course  of the catheter. 2. Potential component of ileus of the colon. Electronically Signed   By: 01/21/2021 M.D.   On: 12/19/2020 13:02   DG Chest Port 1 View  Result Date: 12/18/2020 CLINICAL DATA:  Questionable sepsis. EXAM: PORTABLE CHEST 1 VIEW.  Patient is rotated. COMPARISON:  Chest x-ray 11/21/2020 FINDINGS: The heart size and mediastinal contours are within normal limits. Low lung volumes with streaky airspace opacities at the bases. Question left upper lobe patchy airspace opacity that is poorly visualized on this study. No pulmonary edema. No pleural effusion. No pneumothorax. No acute osseous abnormality. IMPRESSION: Low lung volumes with streaky airspace opacities at the bases that likely represents atelectasis versus infection/inflammation. Question left upper lobe patchy airspace opacity that is poorly visualized on this study. Limited evaluation due to patient rotation. Recommend repeat PA and lateral view of the chest. Electronically Signed   By: 01/21/2021  M.D.   On: 12/18/2020 23:59    Labs:  CBC: Recent Labs    07/26/20 2359 11/21/20 0753 11/22/20 0504 12/18/20 0054  WBC 17.0* 30.7* 9.2 8.1  HGB 12.5* 13.5 10.9* 14.4  HCT 37.7* 40.3 33.9* 44.4  PLT 423* 218 151 204    COAGS: Recent Labs    11/21/20 0753 12/18/20 0054  INR 1.2 1.1  APTT 52* 65*    BMP: Recent Labs    07/26/20 2359 11/21/20 0753 11/22/20 0504 12/18/20 0054  NA 133* 137 140 137  K 3.1* 2.9* 3.5 3.6  CL 97* 107 111 102  CO2 23 20* 19* 25  GLUCOSE 119* 129* 91 100*  BUN 16 29* 20 15  CALCIUM 8.9 9.0 8.9 9.6  CREATININE 0.96 0.99 0.78 0.84  GFRNONAA >60 >60 >60 >60    LIVER FUNCTION TESTS: Recent Labs    11/21/20 0753 12/18/20 0054  BILITOT 0.7 0.9  AST 20 10*  ALT 19 16  ALKPHOS 105 127*  PROT 6.9 7.8  ALBUMIN 3.8 4.2    TUMOR MARKERS: No results for input(s): AFPTM, CEA, CA199, CHROMGRNA in the last 8760 hours.  Assessment and Plan: 45 y.o. male with history of  end-stage multiple sclerosis and neurogenic bladder s/p suprapubic catheter placement with Dr. Archer Asa on 11/21/20.   Patient presents to [facility] IR with his mother for routine suprapubic catheter exchange and upsize.  Npo since midnight Afebrile   Risks and benefits discussed with the patient and his mother including bleeding and infection.  All of the patient's mother's questions were answered, patient and his mother are agreeable to proceed.   Patient has history of MS and not able to make his own medical decision, therefore a written consent was obtained from his mother.  Consent signed and in chart.   Thank you for this interesting consult.  I greatly enjoyed meeting Daelen Belvedere and look forward to participating in their care.  A copy of this report was sent to the requesting provider on this date.  Electronically Signed: Willette Brace, PA-C 01/08/2021, 1:56 PM   I spent a total of    15 Minutes in face to face in clinical consultation, greater than 50% of which was counseling/coordinating care for suprapubic cathter exchange and upsize.

## 2021-01-08 NOTE — Procedures (Signed)
Interventional Radiology Procedure Note  Procedure: fluoro SP tube upsize     Complications: None  Estimated Blood Loss:  0  Findings: 16 entuit balloon tip cath inserted    Sharen Counter, MD

## 2021-01-08 NOTE — Progress Notes (Signed)
Due to MS, Pt's mother at bedside to answer pre IR assessment questions.  Pt oriented to person, situation.  Unable to state year.  States Pt aspirates with head lower than 30 degrees.  Arrived via ambulance. Healthcare directed by [facility] office [phone]), who stated Pt should discharge home via [facility].  Mother assumes full care of Pt at home. Pt is blind and lacks mobility.

## 2021-01-08 NOTE — Progress Notes (Signed)
Waiting on [facility] transportation back to home . Discharge time was 1515.

## 2021-02-10 DIAGNOSIS — I6389 Other cerebral infarction: Secondary | ICD-10-CM | POA: Diagnosis not present

## 2021-02-13 ENCOUNTER — Telehealth: Payer: Self-pay | Admitting: Neurology

## 2021-02-13 NOTE — Telephone Encounter (Signed)
Called mom back and she stated that he doesn't have his faculties or coordination back from the seizures and asked to do a video visit.  Dr. Epimenio Foot said that was fine to do a VV.

## 2021-02-13 NOTE — Telephone Encounter (Signed)
Pt's mother(on DPR) has called to report pt will be released from hospital today after pt had 2 seizures on05-28.  Mother wants to know if Dr Epimenio Foot would still want to see pt on tomorrow.  Please call

## 2021-02-14 ENCOUNTER — Telehealth (INDEPENDENT_AMBULATORY_CARE_PROVIDER_SITE_OTHER): Payer: Medicaid Other | Admitting: Neurology

## 2021-02-14 ENCOUNTER — Encounter: Payer: Self-pay | Admitting: Neurology

## 2021-02-14 DIAGNOSIS — G35 Multiple sclerosis: Secondary | ICD-10-CM

## 2021-02-14 DIAGNOSIS — R32 Unspecified urinary incontinence: Secondary | ICD-10-CM | POA: Diagnosis not present

## 2021-02-14 DIAGNOSIS — R269 Unspecified abnormalities of gait and mobility: Secondary | ICD-10-CM

## 2021-02-14 DIAGNOSIS — R569 Unspecified convulsions: Secondary | ICD-10-CM | POA: Insufficient documentation

## 2021-02-14 MED ORDER — LEVETIRACETAM 750 MG PO TABS
750.0000 mg | ORAL_TABLET | Freq: Two times a day (BID) | ORAL | 11 refills | Status: DC
Start: 2021-02-14 — End: 2021-03-05

## 2021-02-14 NOTE — Progress Notes (Signed)
[facility]  PATIENT: Dennis Mann DOB: [birth year]  REFERRING DOCTOR OR PCP: Imogene Burn, NP; Crist Fat, MD SOURCE: Patient, notes from [facility] MS center  _________________________________   HISTORICAL  CHIEF COMPLAINT:  No chief complaint on file.  UPDATE Virtual Visit via Telephone Note  I connected with Dennis Mann and his mother on 02/14/21 at 10:00 AM EDT by telephone and verified that I am speaking with the correct person using two identifiers.  Location: Patient: home Provider: office   I discussed the limitations, risks, security and privacy concerns of performing an evaluation and management service by telephone and the availability of in person appointments. I also discussed with the patient that there may be a patient responsible charge related to this service. The patient expressed understanding and agreed to proceed.  History of Present Illness: Most of the history from his mother.   He had 2 seizures 02/10/2021 and was takes to [facility] (the second seizure was in the ambulance so they diverted to [facility] instead of [facility] as family had wanted).    He received IV Versed.  The seizure involved the left side more than the right.     After the seizure, he was paralyzed on the left and he was suspected to have a stroke.    However, DWI on MRI showed no acute findings.    Slowly, the left strength improved but he is not at baseline.   Cognitively he is not yet back to baseline.   Since discharge he has improved though left side is not back to baseline.   He is speaking complete sentences again though is confused.    Due to swallowing difficulties he has needed to switch to purred food.  He has palliative care with hospice and gets nurse visits twice a week.    He has a suprapubic tube.   When confused the one night he pulled it out.    He has agitation in the evenings, sometimes earlier.   Hydroxyzine may have helped some.   We discussed  taking in the afternoon and before bedtime planned schedule and can take 1-2 more times as needed.       Assessment and Plan: Multiple sclerosis (HCC)  Gait disturbance  Urinary incontinence, unspecified type  Seizures (HCC)  1.  Continue Keppra and baclofen.  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   Hydroxyzine qPM and qHS and as needed up to 4/day.  If not better consider Risperdal or Zyprexa.  Also consider Depakote if agitation worsens 3.   Ok for Ambien at bedtime 4.   rtc 6-8 week, sooner if new or worsening issues.    Follow Up Instructions: I discussed the assessment and treatment plan with the patient. The patient was provided an opportunity to ask questions and all were answered. The patient agreed with the plan and demonstrated an understanding of the instructions.   The patient was advised to call back or seek an in-person evaluation if the symptoms worsen or if the condition fails to improve as anticipated.  I provided 23 minutes of non-face-to-face time during this encounter.   Asa Lente, MD    MS History from 07/2020: He was diagnosed with MS diagnosed at age 57 in 65 after presenting with diplopia that developed overnight.  His mother noted the eyes were not moving correctly and he went  to the emergency room.  A CT in the ED showed abnormality and a follow up MRI was consistent with MS. CSF reportedly showed abnormalities consistent with MS.  He received IV Solumedrol and was started on Betaseron.   He had breakthrough exacerbations including severe ones with right sided weakness and partial blindness.   He was on Copaxone for a while but had breakthrough exacerbations.   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    He switched to Ocrevus most recently from 2018 to May 2020.  He continued to progress and had his last treatment May  2020.    Mayzent was discussed but they opted not to proceed.   Earlier this year, he moved from [place] (was living with his father) to [place] (to live with mother)  He was stable until early November 2021 when he had an influenza vaccination.  Later that day, he could not move and became less responsive over the next couple of days.Marland Kitchen   He was taken to [facility] and diagnosed with pneumonia.   Although his mental status has returned to normal, he continues to be weaker than he was before the pneumonia.     He was able to transfer with assistance but now a Michiel Sites lift is needd   He last walked with a walker 2 years ago.  Currently, he is wheelchair and bed bound.   He no longer assists with transfers.   He is very weak on the right and weak on the left, legs worse in arms.   The left arm is just minimally weak   he is no longer continent but was partially so until the recent pneumonia.    He has severe spasticity in his legs and has some scissoring making catheterization difficult.  For spasticity, he is on baclofen 10 mg po bid.   Due to dysphagia, he is on a soft and pured diet.  He has severe cognitive dysfunction.  His mother notes that he has difficulty completing sentences at times and has some verbal fluency issues.  He sleeps 14 hours a day.     He was smoking MJ daily x years but has not smoked any since moving in with his mother.  He has had his Covid 19 vaccination but was on Ocrevus at the time  Due to doing poorly after the influenza vaccina a few weeks ago, his mother prefers not to have him get the booster.    Images reviewed: MRI of the brain 12/03/2017 and 09/01/2019 showed moderate generalized cortical atrophy.  There are multiple T2/FLAIR hyperintense foci in the periventricular, juxtacortical deep white matter of the hemispheres and in the midbrain, pons, middle cerebellar peduncles and medulla.  There were no enhancing lesions.  MRI of the cervical spine 12/03/2017  and 09/01/2019 showed movement artifact.  In this context, there are patchy T2 hyperintense foci.  Most prominent foci are at C1, C2 and C6 and T1 and T2 there is also moderately severe spinal stenosis at C6-C7 due to a left paramedian disc protrusion and mild to moderate spinal stenosis at C3-C4 and C4-C5 and C5C6.  There is no abnormal enhancement.  REVIEW OF SYSTEMS: Constitutional: No fevers, chills, sweats, or change in appetite. Eyes: No visual changes,eye pain.  He has diplopia Ear, nose and throat: No hearing loss, ear pain, nasal congestion, sore throat Cardiovascular: No chest pain, palpitations Respiratory: No shortness of breath at rest or with exertion.   No  wheezes GastrointestinaI: No nausea, vomiting, diarrhea, abdominal pain, fecal incontinence Genitourinary:Incontinence as above Musculoskeletal:He has lower back pain. Integumentary: No rash, pruritus, skin lesions Neurological: as above Psychiatric: No depression at this time.  No anxiety Endocrine: No palpitations, diaphoresis, change in appetite, change in weigh or increased thirst Hematologic/Lymphatic: No anemia, purpura, petechiae. Allergic/Immunologic: No itchy/runny eyes, nasal congestion, recent allergic reactions, rashes  ALLERGIES: No Known Allergies  HOME MEDICATIONS:  Current Outpatient Medications:  .  ALPRAZOLAM PO, Take 2.5 mg by mouth at bedtime as needed for anxiety., Disp: , Rfl:  .  baclofen (LIORESAL) 10 MG tablet, Take 10 mg in the morning, 10 mg in the afternoon and 20 mg at night (Patient taking differently: Take 10 mg by mouth See admin instructions. Take 10 mg in the morning, 10 mg in the afternoon and 20 mg at night), Disp: 120 each, Rfl: 5 .  DULCOLAX 10 MG suppository, Place 10 mg rectally daily as needed for constipation. No bm in 3 days, Disp: , Rfl:  .  methenamine (HIPREX) 1 g tablet, Take 1 g by mouth 2 (two) times daily., Disp: , Rfl:  .  Morphine Sulfate (MORPHINE CONCENTRATE) 10 mg  / 0.5 ml concentrated solution, Take 0.25-0.5 mLs by mouth every 2 (two) hours as needed for pain or shortness of breath., Disp: , Rfl:  .  ondansetron (ZOFRAN) 4 MG tablet, Take 4 mg by mouth every 4 (four) hours as needed for nausea/vomiting., Disp: , Rfl:   PAST MEDICAL HISTORY: Past Medical History:  Diagnosis Date  . Anal fissure   . Cognitive impairment   . Essential hypertension   . Hyperlipidemia   . Multiple sclerosis (HCC)     PAST SURGICAL HISTORY: None  FAMILY HISTORY: Family History  Problem Relation Age of Onset  . Heart disease Maternal Grandfather   . Diabetes Paternal Grandfather   . Colon cancer Paternal Grandmother     SOCIAL HISTORY:    Mael Delap A. Epimenio Foot, MD, [facility] 02/14/2021, 10:37 AM Certified in Neurology, Clinical Neurophysiology, Sleep Medicine and Neuroimaging  [facility] [address], [address] [place], [place] [address] [phone]

## 2021-02-20 ENCOUNTER — Other Ambulatory Visit: Payer: Self-pay | Admitting: Neurology

## 2021-02-20 MED ORDER — OXCARBAZEPINE 300 MG PO TABS: 300.0000 mg | ORAL_TABLET | Freq: Two times a day (BID) | ORAL | 5 refills | Status: DC

## 2021-03-05 ENCOUNTER — Telehealth: Payer: Self-pay | Admitting: Neurology

## 2021-03-05 ENCOUNTER — Other Ambulatory Visit: Payer: Self-pay | Admitting: Neurology

## 2021-03-05 MED ORDER — LEVETIRACETAM 100 MG/ML PO SOLN: 1000.0000 mg | Freq: Two times a day (BID) | ORAL | 11 refills | Status: DC

## 2021-03-05 NOTE — Telephone Encounter (Signed)
Sent mychart message back.

## 2021-03-05 NOTE — Telephone Encounter (Signed)
Called mother. Scheduled work in appt for 03/12/21 at 330pm, check in 300pm.

## 2021-03-05 NOTE — Telephone Encounter (Signed)
Sent mychart message

## 2021-03-05 NOTE — Telephone Encounter (Signed)
Please review my chart message from 03/02/2021 for reference.  I would recommend that he continue with Keppra 750 mg twice daily in pill form as indicated, mixed with yogurt.  I would recommend observing for his seizures and seizure-like activity for now.  His seizure activity is possibly also a function of other factors such as fluid intake and blood sugar fluctuation.  Please encourage regular fluid and food intake as best as possible for now, I recommend an update in the next few days, we can consider increasing the Keppra if need be.

## 2021-03-05 NOTE — Telephone Encounter (Signed)
Pt's mother called wanting him to be seen sooner. Please advise.

## 2021-03-12 ENCOUNTER — Ambulatory Visit: Payer: Medicaid Other | Admitting: Neurology

## 2021-03-12 ENCOUNTER — Encounter: Payer: Self-pay | Admitting: Neurology

## 2021-03-12 VITALS — BP 122/76 | HR 125 | Ht 69.0 in

## 2021-03-12 DIAGNOSIS — G35 Multiple sclerosis: Secondary | ICD-10-CM | POA: Diagnosis not present

## 2021-03-12 DIAGNOSIS — R339 Retention of urine, unspecified: Secondary | ICD-10-CM

## 2021-03-12 DIAGNOSIS — R1310 Dysphagia, unspecified: Secondary | ICD-10-CM | POA: Diagnosis not present

## 2021-03-12 DIAGNOSIS — R569 Unspecified convulsions: Secondary | ICD-10-CM | POA: Diagnosis not present

## 2021-03-12 DIAGNOSIS — Z79899 Other long term (current) drug therapy: Secondary | ICD-10-CM

## 2021-03-12 DIAGNOSIS — G500 Trigeminal neuralgia: Secondary | ICD-10-CM

## 2021-03-12 DIAGNOSIS — R269 Unspecified abnormalities of gait and mobility: Secondary | ICD-10-CM

## 2021-03-12 NOTE — Progress Notes (Signed)
[facility]  PATIENT: Dennis Mann DOB: 1975/12/13  REFERRING DOCTOR OR PCP: Imogene Burn, NP; Crist Fat, MD SOURCE: Patient, notes from [place] MS center  _________________________________   HISTORICAL  CHIEF COMPLAINT:  Chief Complaint  Patient presents with   Follow-up    RM 12. Last seen 02/14/2021.    UPDATE Dennis Mann is a 45 y.o. man with MS and seizures.  History of Present Illness: He has MS and has had more weakness ibn his legs.   Arms are strong.   This worsened when he had the seizures.    He has not returned to baseline since the 2 seizures.   He is eating worse and is weaker.  His seizures have done better since starting oxcarbazapine.   He had 2 seizures 02/10/2021 and was takes to [facility] (the second seizure was in the ambulance so they diverted to [facility] instead of [facility] as family had wanted).    He received IV Versed.  The seizure involved the left side more than the right.     After the seizure, he was paralyzed on the left and he was suspected to have a stroke.    However, DWI on MRI showed no acute findings.    Slowly, the left strength improved but he is not at baseline.   Cognitively he is not yet back to baseline.   Due to swallowing difficulties he has needed to switch to purred food.  He has palliative care with hospice and gets nurse visits twice a week.    He has a suprapubic tube.   When confused the one night he pulled it out.    He had trigeminal neuralgia  He has confusion and seems to be taking memory of his youth and believing thy have recently occurred.  He has agitation in the evenings, sometimes earlier.   He is taking valium the last coiupe days(2.5 mg)    MS History : He was diagnosed with MS diagnosed at age 42 in 76 after presenting with diplopia that developed overnight.  His mother noted the eyes were not moving correctly and he went to the emergency room.  A CT in the ED showed abnormality and a  follow up MRI was consistent with MS. CSF reportedly showed abnormalities consistent with MS.  He received IV Solumedrol and was started on Betaseron.   He had breakthrough exacerbations including severe ones with right sided weakness and partial blindness.   He was on Copaxone for a while but had breakthrough exacerbations.   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    He switched to Ocrevus most recently from 2018 to May 2020.  He continued to progress and had his last treatment May 2020.    Mayzent was discussed but they opted not to proceed.   Earlier this year, he moved from [place] (was living with his father) to [place] (to live with mother)  He was stable until early November 2021 when he had an influenza vaccination.  Later that day, he could not move and became less responsive over the next couple of days.Marland Kitchen   He was taken to [facility] and diagnosed with pneumonia.   Although his mental status has returned to normal, he continues to be weaker than he was before the pneumonia.     He was able to transfer with assistance but now a Michiel Sites lift is needd   He last walked with a  walker 2 years ago.  Currently, he is wheelchair and bed bound.   He no longer assists with transfers.   He is very weak on the right and weak on the left, legs worse in arms.   The left arm is just minimally weak   he is no longer continent but was partially so until the recent pneumonia.    He has severe spasticity in his legs and has some scissoring making catheterization difficult.  For spasticity, he is on baclofen 10 mg po bid.   Due to dysphagia, he is on a soft and pured diet.  He has severe cognitive dysfunction.  His mother notes that he has difficulty completing sentences at times and has some verbal fluency issues.  He sleeps 14 hours a day.     He was smoking MJ daily x years but has not smoked any since moving in with his mother.  He has had his Covid 19 vaccination but was on  Ocrevus at the time  Due to doing poorly after the influenza vaccina a few weeks ago, his mother prefers not to have him get the booster.    Images reviewed: MRI of the brain 12/03/2017 and 09/01/2019 showed moderate generalized cortical atrophy.  There are multiple T2/FLAIR hyperintense foci in the periventricular, juxtacortical deep white matter of the hemispheres and in the midbrain, pons, middle cerebellar peduncles and medulla.  There were no enhancing lesions.  MRI of the cervical spine 12/03/2017 and 09/01/2019 showed movement artifact.  In this context, there are patchy T2 hyperintense foci.  Most prominent foci are at C1, C2 and C6 and T1 and T2 there is also moderately severe spinal stenosis at C6-C7 due to a left paramedian disc protrusion and mild to moderate spinal stenosis at C3-C4 and C4-C5 and C5C6.  There is no abnormal enhancement.  REVIEW OF SYSTEMS: Constitutional: No fevers, chills, sweats, or change in appetite. Eyes: No visual changes,eye pain.  He has diplopia Ear, nose and throat: No hearing loss, ear pain, nasal congestion, sore throat Cardiovascular: No chest pain, palpitations Respiratory:  No shortness of breath at rest or with exertion.   No wheezes GastrointestinaI: No nausea, vomiting, diarrhea, abdominal pain, fecal incontinence Genitourinary: Incontinence as above Musculoskeletal: He has lower back pain. Integumentary: No rash, pruritus, skin lesions Neurological: as above Psychiatric: No depression at this time.  No anxiety Endocrine: No palpitations, diaphoresis, change in appetite, change in weigh or increased thirst Hematologic/Lymphatic:  No anemia, purpura, petechiae. Allergic/Immunologic: No itchy/runny eyes, nasal congestion, recent allergic reactions, rashes  ALLERGIES: No Known Allergies  HOME MEDICATIONS:  Current Outpatient Medications:    baclofen (LIORESAL) 10 MG tablet, Take 10 mg in the morning, 10 mg in the afternoon and 20 mg at night  (Patient taking differently: Take 10 mg by mouth See admin instructions. Take 10 mg in the morning, 10 mg in the afternoon and 20 mg at night), Disp: 120 each, Rfl: 5   diazepam (VALIUM) 5 MG tablet, Take 5 mg by mouth every 6 (six) hours as needed for anxiety. 1/2 tablet at 8am, 2pm and 1 tablet at bedtime, Disp: , Rfl:    DULCOLAX 10 MG suppository, Place 10 mg rectally daily as needed for constipation. No bm in 3 days, Disp: , Rfl:    levETIRAcetam (KEPPRA) 100 MG/ML solution, Take 10 mLs (1,000 mg total) by mouth 2 (two) times daily., Disp: 600 mL, Rfl: 11   losartan-hydrochlorothiazide (HYZAAR) 50-12.5 MG tablet, Take 0.5 tablets by mouth daily.,  Disp: , Rfl:    methenamine (HIPREX) 1 g tablet, Take 1 g by mouth 2 (two) times daily., Disp: , Rfl:    ondansetron (ZOFRAN) 4 MG tablet, Take 4 mg by mouth every 4 (four) hours as needed for nausea/vomiting., Disp: , Rfl:    Oxcarbazepine (TRILEPTAL) 300 MG tablet, Take 1 tablet (300 mg total) by mouth 2 (two) times daily. (Patient taking differently: Take 300 mg by mouth 2 (two) times daily. 1200mg  total, takes 300 BID and 600mg  qhs), Disp: 60 tablet, Rfl: 5   sertraline (ZOLOFT) 50 MG tablet, Take 50 mg by mouth daily., Disp: , Rfl:    Morphine Sulfate (MORPHINE CONCENTRATE) 10 mg / 0.5 ml concentrated solution, Take 0.25-0.5 mLs by mouth every 2 (two) hours as needed for pain or shortness of breath. (Patient not taking: Reported on 03/12/2021), Disp: , Rfl:   PAST MEDICAL HISTORY: Past Medical History:  Diagnosis Date   Anal fissure    Cognitive impairment    Essential hypertension    Hyperlipidemia    Multiple sclerosis (HCC)     PAST SURGICAL HISTORY: None  FAMILY HISTORY: Family History  Problem Relation Age of Onset   Heart disease Maternal Grandfather    Diabetes Paternal Grandfather    Colon cancer Paternal Grandmother   PHYSICAL EXAM:   Today's Vitals   03/12/21 1513  BP: 122/76  Pulse: (!) 125  Height: 5\' 9"  (1.753 m)    Body mass index is 23.77 kg/m.    General: The patient is well-developed and well-nourished and in no acute distress.  He is wheelchair-bound   HEENT:  Head is /AT.  Sclera are anicteric.    Skin: Extremities are without rash or  edema.     Neurologic Exam   Mental status: The patient is alert .  He has reduced focus and memory.   Speech is normal.   Cranial nerves: He has bilateral INO's.. Pupils are equal, round, and reactive to light and accomodation. He has reduced vision bilaterally, able to count fingers but not read, left worse than rightl.  Facial symmetry is present. There is good facial sensation to soft touch bilaterally.Facial strength is normal.  Trapezius and sternocleidomastoid strength is normal. No dysarthria is noted.  Hearing seems normal.    Motor:  Muscle bulk is normal.   Muscle tone is increased, right greater than left, legs greater than arms.  Strength is 4+ to 5/5 in the left arm, 4+/5 in the right arm except for 4/5 finger extensors.  Strength is 4 -/5 in the left leg and 2+-3/5 in the proximal and distal right leg x 4-/5 quads   Sensory: Sensory testing is difficult but he appears to have intact to pinprick, soft touch and vibration sensation in arms and mildly reduced vibration sensation in the legs.   Coordination: Reduced finger-nose-finger on the right.  Heel-to-shin is poor on the left that he cannot do on the right.   Gait and station: He is unable to stand   Reflexes: Deep tendon reflexes are increased in legs, right greater than left.  He has crossed abductors at the right knee and nonsustained clonus at the right ankle   plantar responses are extensor.    IMPRESSION/PLAN  Multiple sclerosis (HCC) - Plan: SLP modified barium swallow, DG Swallowing Func-Speech Pathology  Dysphagia, unspecified type - Plan: SLP modified barium swallow, DG Swallowing Func-Speech Pathology  Seizures (HCC) - Plan: Comprehensive metabolic panel,  [id number]  Urinary retention  Gait disturbance  Trigeminal neuralgia  High risk medication use - Plan: Comprehensive metabolic panel, 10-Hydroxycarbazepine   Plan:  Continue Trileptal and d/c Keppa for seizures.  Hopefully, the agitation will improve with less medication. 2.    Continue valium .   If not better consider low dose Risperdal.  Also consider Depakote if agitation worsens 3.    rtc 6-8 week, sooner if new or worsening issues.       Andilynn Delavega A. Epimenio Foot, MD, [facility] 03/12/2021, 5:55 PM Certified in Neurology, Clinical Neurophysiology, Sleep Medicine and Neuroimaging  [facility] [address], [address] [place], [place] [address] [phone]

## 2021-03-16 ENCOUNTER — Other Ambulatory Visit (HOSPITAL_COMMUNITY): Payer: Self-pay | Admitting: *Deleted

## 2021-03-16 DIAGNOSIS — R1310 Dysphagia, unspecified: Secondary | ICD-10-CM

## 2021-03-21 NOTE — Telephone Encounter (Signed)
Called [facility] at [phone]. Account: [id number]. Spoke w/ Lowella Bandy. Standard turn around time for oxcarbazepine level is 2-4 days. She placed me on hold to get ETA for lab result from department. Test had to be repeated, could not give reason why. Results should be ready by this Friday, 03/23/21.

## 2021-03-27 ENCOUNTER — Ambulatory Visit: Payer: Self-pay | Admitting: Neurology

## 2021-03-29 ENCOUNTER — Ambulatory Visit (HOSPITAL_COMMUNITY)
Admission: RE | Admit: 2021-03-29 | Discharge: 2021-03-29 | Disposition: A | Discharge status: Home / Self Care | Payer: Medicaid Other | Source: Ambulatory Visit | Attending: Neurology | Admitting: Neurology

## 2021-03-29 ENCOUNTER — Other Ambulatory Visit: Payer: Self-pay

## 2021-03-29 DIAGNOSIS — R1310 Dysphagia, unspecified: Secondary | ICD-10-CM | POA: Diagnosis present

## 2021-03-29 DIAGNOSIS — G35 Multiple sclerosis: Secondary | ICD-10-CM

## 2021-03-30 LAB — COMPREHENSIVE METABOLIC PANEL
ALT: 17 IU/L (ref 0–44)
AST: 14 IU/L (ref 0–40)
Albumin/Globulin Ratio: 1.9 (ref 1.2–2.2)
Albumin: 4.8 g/dL (ref 4.0–5.0)
Alkaline Phosphatase: 173 IU/L — ABNORMAL HIGH (ref 44–121)
BUN/Creatinine Ratio: 14 (ref 9–20)
BUN: 10 mg/dL (ref 6–24)
Bilirubin Total: <0.2 mg/dL (ref 0.0–1.2)
CO2: 23 mmol/L (ref 20–29)
Calcium: 10.2 mg/dL (ref 8.7–10.2)
Chloride: 99 mmol/L (ref 96–106)
Creatinine, Ser: 0.74 mg/dL — ABNORMAL LOW (ref 0.76–1.27)
Globulin, Total: 2.5 g/dL (ref 1.5–4.5)
Glucose: 99 mg/dL (ref 65–99)
Potassium: 4.2 mmol/L (ref 3.5–5.2)
Sodium: 138 mmol/L (ref 134–144)
Total Protein: 7.3 g/dL (ref 6.0–8.5)
eGFR: 113 mL/min/{1.73_m2} (ref >59)

## 2021-03-30 LAB — 10-HYDROXYCARBAZEPINE: Oxcarbazepine SerPl-Mcnc: 31 ug/mL (ref 10–35)

## 2021-05-18 ENCOUNTER — Telehealth: Payer: Self-pay | Admitting: Neurology

## 2021-05-18 NOTE — Telephone Encounter (Signed)
Noted, I have updated the pharmacy

## 2021-05-18 NOTE — Telephone Encounter (Signed)
Pt's mother called to give a pharmacy change, new pharmacy is [facility] [address] [place], [place] [address] Store [phone].

## 2021-07-03 ENCOUNTER — Ambulatory Visit (INDEPENDENT_AMBULATORY_CARE_PROVIDER_SITE_OTHER): Payer: Medicaid Other | Admitting: Neurology

## 2021-07-03 ENCOUNTER — Encounter: Payer: Self-pay | Admitting: Neurology

## 2021-07-03 DIAGNOSIS — R1310 Dysphagia, unspecified: Secondary | ICD-10-CM | POA: Insufficient documentation

## 2021-07-03 DIAGNOSIS — R531 Weakness: Secondary | ICD-10-CM | POA: Diagnosis not present

## 2021-07-03 DIAGNOSIS — G500 Trigeminal neuralgia: Secondary | ICD-10-CM

## 2021-07-03 DIAGNOSIS — G35 Multiple sclerosis: Secondary | ICD-10-CM

## 2021-07-03 DIAGNOSIS — R569 Unspecified convulsions: Secondary | ICD-10-CM | POA: Diagnosis not present

## 2021-07-03 DIAGNOSIS — R339 Retention of urine, unspecified: Secondary | ICD-10-CM

## 2021-07-03 MED ORDER — BACLOFEN 10 MG PO TABS: ORAL_TABLET | ORAL | 5 refills | Status: DC

## 2021-07-03 NOTE — Progress Notes (Signed)
[facility]  PATIENT: Dennis Mann DOB: 1976-02-21  REFERRING DOCTOR OR PCP: Imogene Burn, NP; Crist Fat, MD SOURCE: Patient, notes from [place] [facility]  _________________________________   HISTORICAL  CHIEF COMPLAINT:  Chief Complaint  Patient presents with   Multiple Sclerosis    Virtual Visit via Telephone Note  I connected with Dennis Mann 's [name] on 07/03/21 at  3:00 PM EDT by telephone and verified that I am speaking with the correct person using two identifiers.  Dennis Mann was present but is nearly mute Location: Patient: Home Provider: Office  I discussed the limitations, risks, security and privacy concerns of performing an evaluation and management service by telephone and the availability of in person appointments. I also discussed with the patient that there may be a patient responsible charge related to this service. The patient expressed understanding and agreed to proceed.   History of Present Illness: UPDATE 07/03/21 Dennis Mann is a 45 y.o. man with MS and seizures.  Due to the severity of his MS, he is bedridden and has sparse speech.  He lives with his mom.  He is served by [employer] [facility] [employer].   They send out a nurse three times a week and the doctor does a house-call once a month.   The legs are weak and spastic.  His arms are fairly strong.    He has had a recent infection, likely aspiration pneumonia.   Mental status worsened and is not yet to baseline.     He is often scared and does not lime being alone.    His mom has not noted any recent seizure.     He spends most of the day in bed.  The family gets him up for a short time most days but he is wiped out after a few minuteds and wants to go back to bed.        He had trigeminal neuralgia greatly helped by oxcarbazepine.    He continues to be confused, especially with knowing the difference between recent events and events from his youth.  He has  agitation in the evenings, sometimes earlier.   Valium helps the agitation.  He had 2 seizures 02/10/2021  The seizure involved the left side more than the right.     After the seizure, he was paralyzed on the left and he was suspected to have a stroke.    Strength and cognition took a couple weeks to get back to baseline.. Oxcarbazepine and Keppra combination has helped.    Due to swallowing difficulties he has needed to switch to purred food.  He still chokes some on his food.   He has palliative care with hospice and gets nurse visits twice a week.      He has a suprapubic catheter and his mom replaces it every month.         MS History : He was diagnosed with MS diagnosed at age 1 in 47 after presenting with diplopia that developed overnight.  His Mann noted the eyes were not moving correctly and he went to the emergency room.  A CT in the ED showed abnormality and a follow up MRI was consistent with MS. CSF reportedly showed abnormalities consistent with MS.  He received IV Solumedrol and was started on Betaseron.   He had breakthrough exacerbations including severe ones with right sided weakness and partial blindness.   He was on Copaxone for a while but had breakthrough exacerbations.  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    He switched to Ocrevus most recently from 2018 to May 2020.  He continued to progress and had his last treatment May 2020.    Mayzent was discussed but they opted not to proceed.   Earlier this year, he moved from [place] (was living with his father) to [place] (to live with Mann)  Images reviewed: MRI of the brain 12/03/2017 and 09/01/2019 showed moderate generalized cortical atrophy.  There are multiple T2/FLAIR hyperintense foci in the periventricular, juxtacortical deep white matter of the hemispheres and in the midbrain, pons, middle cerebellar peduncles and medulla.  There were no enhancing lesions.  MRI of the cervical spine  12/03/2017 and 09/01/2019 showed movement artifact.  In this context, there are patchy T2 hyperintense foci.  Most prominent foci are at C1, C2 and C6 and T1 and T2 there is also moderately severe spinal stenosis at C6-C7 due to a left paramedian disc protrusion and mild to moderate spinal stenosis at C3-C4 and C4-C5 and C5C6.  There is no abnormal enhancement.  REVIEW OF SYSTEMS: Constitutional: No fevers, chills, sweats, or change in appetite. Eyes: No visual changes,eye pain.  He has diplopia Ear, nose and throat: No hearing loss, ear pain, nasal congestion, sore throat Cardiovascular: No chest pain, palpitations Respiratory:  No shortness of breath at rest or with exertion.   No wheezes GastrointestinaI: No nausea, vomiting, diarrhea, abdominal pain, fecal incontinence Genitourinary: Incontinence as above Musculoskeletal: He has lower back pain. Integumentary: No rash, pruritus, skin lesions Neurological: as above Psychiatric: No depression at this time.  No anxiety Endocrine: No palpitations, diaphoresis, change in appetite, change in weigh or increased thirst Hematologic/Lymphatic:  No anemia, purpura, petechiae. Allergic/Immunologic: No itchy/runny eyes, nasal congestion, recent allergic reactions, rashes  ALLERGIES: No Known Allergies  HOME MEDICATIONS:  Current Outpatient Medications:    baclofen (LIORESAL) 10 MG tablet, Take 10 mg in the morning, 10 mg in the afternoon and 20 mg at night, Disp: 120 each, Rfl: 5   diazepam (VALIUM) 5 MG tablet, Take 5 mg by mouth every 6 (six) hours as needed for anxiety. 1/2 tablet at 8am, 2pm and 1 tablet at bedtime, Disp: , Rfl:    losartan-hydrochlorothiazide (HYZAAR) 50-12.5 MG tablet, Take 0.5 tablets by mouth daily., Disp: , Rfl:    methenamine (HIPREX) 1 g tablet, Take 1 g by mouth 2 (two) times daily., Disp: , Rfl:    ondansetron (ZOFRAN) 4 MG tablet, Take 4 mg by mouth every 4 (four) hours as needed for nausea/vomiting., Disp: , Rfl:     Oxcarbazepine (TRILEPTAL) 300 MG tablet, Take 1 tablet (300 mg total) by mouth 2 (two) times daily. (Patient taking differently: Take 300 mg by mouth 2 (two) times daily. 1200mg  total, takes 300 BID and 600mg  qhs), Disp: 60 tablet, Rfl: 5   sertraline (ZOLOFT) 50 MG tablet, Take 50 mg by mouth daily., Disp: , Rfl:   PAST MEDICAL HISTORY: Past Medical History:  Diagnosis Date   Anal fissure    Cognitive impairment    Essential hypertension    Hyperlipidemia    Multiple sclerosis (HCC)     PAST SURGICAL HISTORY: None  FAMILY HISTORY: Family History  Problem Relation Age of Onset   Heart disease Maternal Grandfather    Diabetes Paternal Grandfather    Colon cancer Paternal Grandmother    _________________________________________   IMPRESSION/PLAN  Multiple sclerosis (HCC)  Seizures (HCC)  Dysphagia, unspecified type  Weakness  Trigeminal neuralgia  Urinary retention   Plan:  Continue Trileptal for seizures and trigeminal neuralgia.. 2.    Continue valium as needed for agitation.    consider Depakote if agitation worsens 3.    Resume baclofen and titrate to 10 mg 3 to 4 pills a day  rtc 6 months, sooner if new or worsening issues.     Follow Up Instructions: I discussed the assessment and treatment plan with the patient. The patient was provided an opportunity to ask questions and all were answered. The patient agreed with the plan and demonstrated an understanding of the instructions.   The patient was advised to call back or seek an in-person evaluation if the symptoms worsen or if the condition fails to improve as anticipated.  I provided 24 minutes of non-face-to-face time during this encounter.   Kaelei Wheeler A. Epimenio Foot, MD, [facility] 07/03/2021, 3:20 PM Certified in Neurology, Clinical Neurophysiology, Sleep Medicine and Neuroimaging  [facility] [address] [place], [place] [address] [phone]

## 2021-11-22 IMAGING — DX DG CHEST 2V
2 series · 2 of 2 positions shown · non-contrast
Comparison: None.

CLINICAL DATA: Dyspnea

EXAM:
CHEST - 2 VIEW

[chest lat]
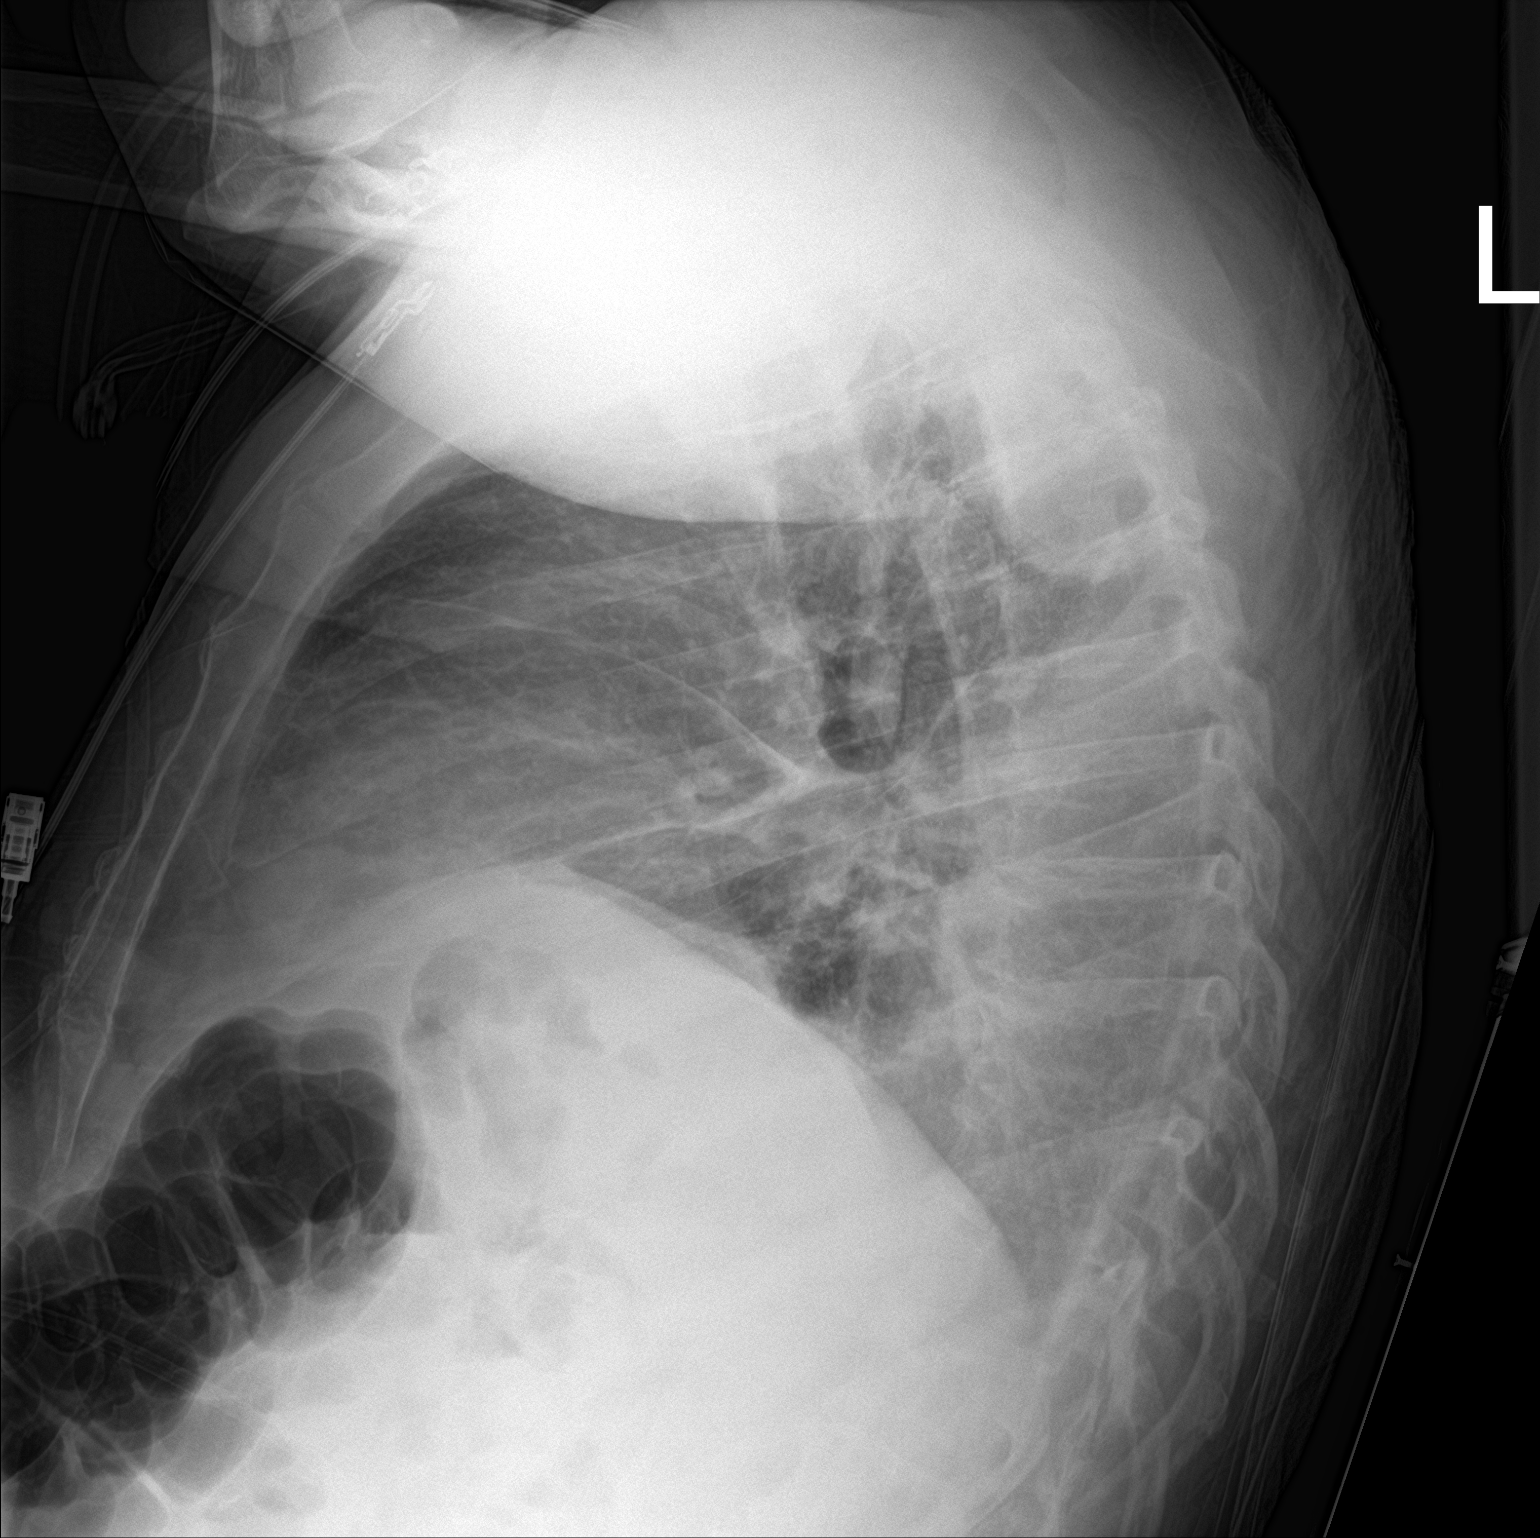

[chest ap]
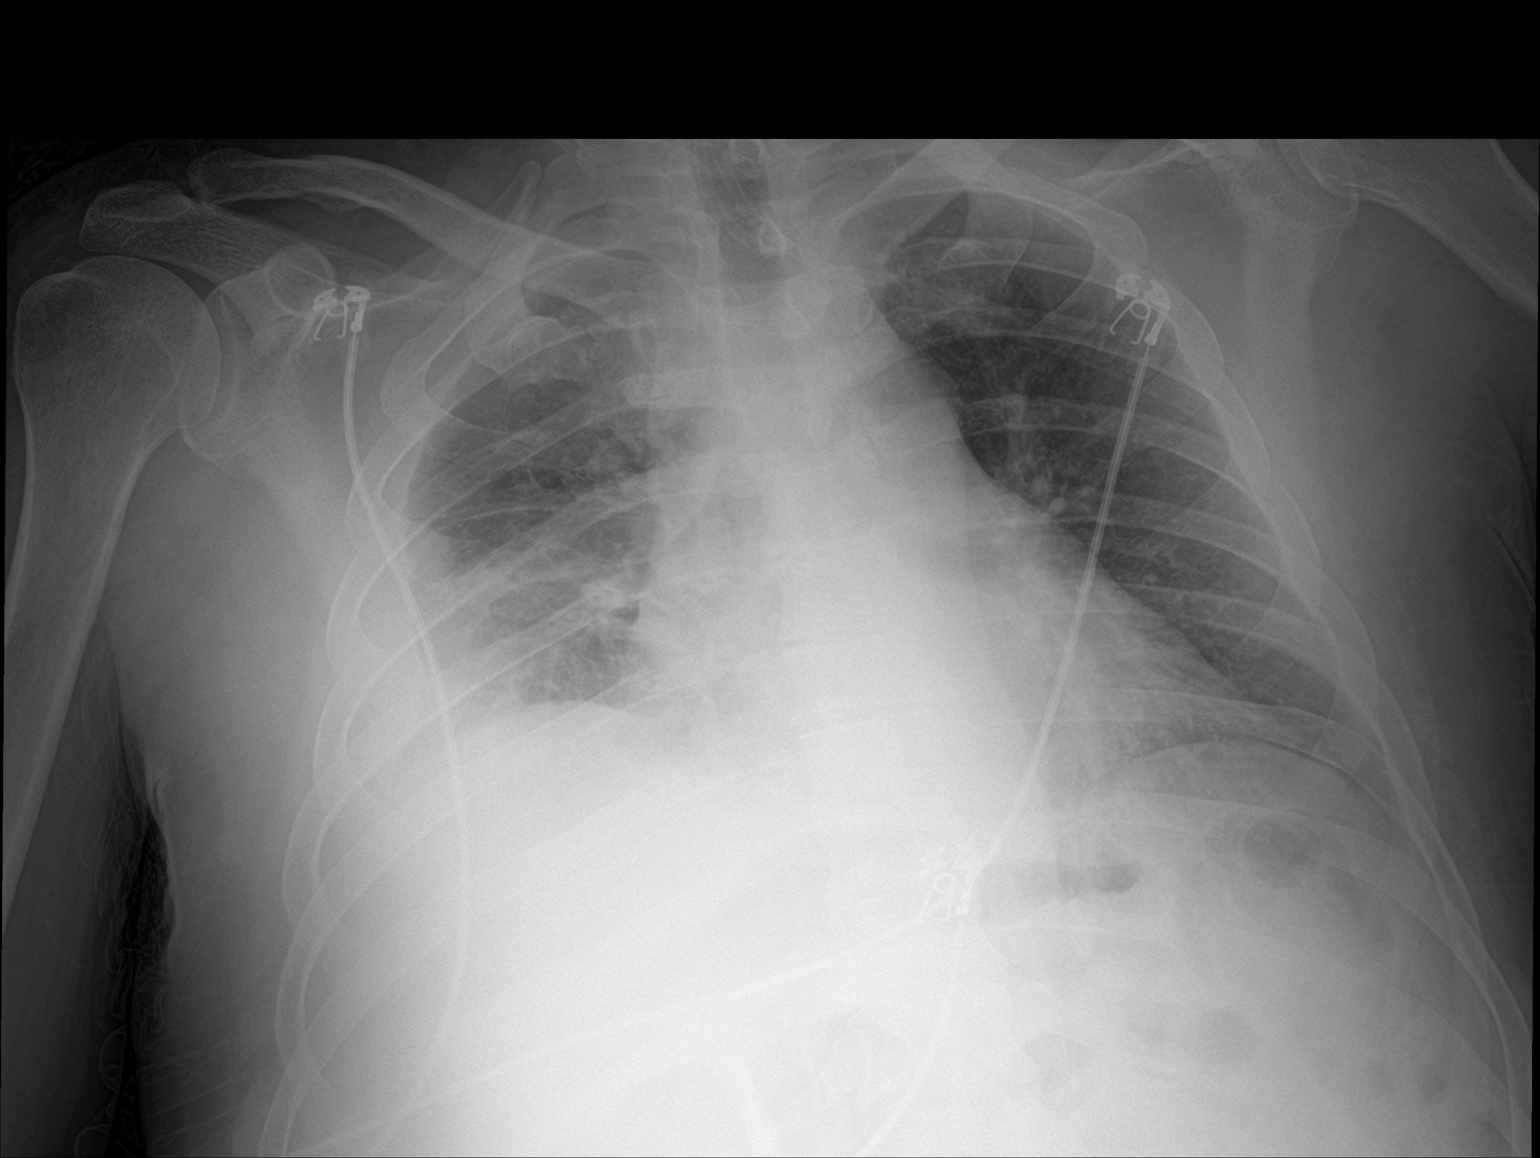

[2 of 2 positions shown; findings below may reference images not displayed]

FINDINGS: Focal pulmonary infiltrate is seen within the a right lung base,
possibly infectious in the acute setting. There is a superimposed
posteriorly and laterally loculated moderate right pleural effusion
possibly representing a parapneumonic effusion. Left lung is clear.
No pneumothorax. No pleural effusion on the left. Cardiac size
within normal limits. No acute bone abnormality.
IMPRESSION: Left lower lobe pneumonia. Associated moderate loculated probable
parapneumonic effusion.

## 2022-01-02 ENCOUNTER — Other Ambulatory Visit: Payer: Self-pay | Admitting: Neurology

## 2022-01-02 ENCOUNTER — Encounter: Payer: Self-pay | Admitting: Neurology

## 2022-01-02 ENCOUNTER — Telehealth: Payer: Self-pay | Admitting: Neurology

## 2022-01-02 ENCOUNTER — Ambulatory Visit: Payer: Medicaid Other | Admitting: Neurology

## 2022-01-02 VITALS — BP 121/81 | HR 105 | Ht 69.0 in

## 2022-01-02 DIAGNOSIS — G801 Spastic diplegic cerebral palsy: Secondary | ICD-10-CM

## 2022-01-02 DIAGNOSIS — R569 Unspecified convulsions: Secondary | ICD-10-CM

## 2022-01-02 DIAGNOSIS — R339 Retention of urine, unspecified: Secondary | ICD-10-CM

## 2022-01-02 DIAGNOSIS — G500 Trigeminal neuralgia: Secondary | ICD-10-CM

## 2022-01-02 DIAGNOSIS — G35 Multiple sclerosis: Secondary | ICD-10-CM

## 2022-01-02 NOTE — Telephone Encounter (Signed)
Called [employer] spoke with Lily Lovings, she states PA is not required for CPT code [phone] ICD-10 G80.1. Reference number for the call [id number]. ?

## 2022-01-02 NOTE — Progress Notes (Signed)
? ?[facility] ? ?PATIENT: Dennis Mann ?DOB: [birth year] ? ?REFERRING DOCTOR OR PCP: Werner Lean, NP; [name], MD ?SOURCE: Patient, notes from [place] [place] center ? ?_________________________________ ? ? ?HISTORICAL ? ?CHIEF COMPLAINT:  ?Chief Complaint  ?Patient presents with  ? Follow-up  ?  Pt with mom, rm 2. Overall having several infections from aspiration. His knees have been painful and contracted. Not using the baclofen because felt like it was interfering with seizure meds.  ? ? ?Dennis Mann is a 46 y.o. man with MS and seizures. ? ?History of Present Illness: ?Update 01/02/2022 ?He has had 3 episodes of aspiration pneumonia since his last visit.    He became delirious when he had the infections.    Due to swallowing difficulties he has needed to switch to purred food.  He has palliative care with hospice and gets nurse visits twice a week.    He has a suprapubic tube.      ? ?He has cogitive dysfunction and sundowns some evenings, even when not sick.   Valium helps.   He has trouble with time and thinks memories from distant past are current.    ? ?He has trigeminal neuralgia which acts up more when he had aspiration pneumonia.   ? ?He has not had more seizures since the last visit.    ? ?He has severe spasticity in the legs.    He is having more pain in the legs.  Additionally, due to the spasticity has become more difficult for his caregivers to do some of the care or put on socks.  Baclofen was not helping so they stopped.   Tylenol helps the knee pain and he sometimes needs a pain medication.    He uses a heating pad for the knee pain.   His legs are fairly locked.  He is unable to assist with transfers or bear any weight.  They have a [employer] fast streamer.  His arms are strong though he has reduced coordination, especially in the left   ?  ? ? ?MS History : ?He was diagnosed with MS diagnosed at age 37 in 24 after presenting with diplopia that developed overnight.  His mother  noted the eyes were not moving correctly and he went to the emergency room.  A CT in the ED showed abnormality and a follow up MRI was consistent with MS. CSF reportedly showed abnormalities consistent with MS.  He received IV Solumedrol and was started on Betaseron.   He had breakthrough exacerbations including severe ones with right sided weakness and partial blindness.   He was on Copaxone for a while but had breakthrough exacerbations.   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    He switched to [place] most recently from 2018 to May 2020.  He continued to progress and had his last treatment May 2020.    Mayzent was discussed but they opted not to proceed.   Earlier this year, he moved from [place] (was living with his father) to [place] (to live with mother) ? ?He was stable until early November 2021 when he had an influenza vaccination.  Later that day, he could not move and became less responsive over the next couple of days.Marland Kitchen   He was taken to [facility] and diagnosed with pneumonia.   Although his mental status has returned to normal, he continues to be weaker than he was before the pneumonia.     He  was able to transfer with assistance but now a Harrel Lemon lift is needd   EMS was able to use a walker around 2017. ?  ? ?Images reviewed: ?MRI of the brain 12/03/2017 and 09/01/2019 showed moderate generalized cortical atrophy.  There are multiple T2/FLAIR hyperintense foci in the periventricular, juxtacortical deep white matter of the hemispheres and in the midbrain, pons, middle cerebellar peduncles and medulla.  There were no enhancing lesions. ? ?MRI of the cervical spine 12/03/2017 and 09/01/2019 showed movement artifact.  In this context, there are patchy T2 hyperintense foci.  Most prominent foci are at C1, C2 and C6 and T1 and T2 there is also moderately severe spinal stenosis at C6-C7 due to a left paramedian disc protrusion and mild to moderate spinal stenosis at C3-C4 and  C4-C5 and C5C6.  There is no abnormal enhancement. ? ?REVIEW OF SYSTEMS: ?Constitutional: No fevers, chills, sweats, or change in appetite. ?Eyes: No visual changes,eye pain.  He has diplopia ?Ear, nose and throat: No hearing loss, ear pain, nasal congestion, sore throat ?Cardiovascular: No chest pain, palpitations ?Respiratory:  No shortness of breath at rest or with exertion.   No wheezes ?GastrointestinaI: No nausea, vomiting, diarrhea, abdominal pain, fecal incontinence ?Genitourinary: Incontinence as above ?Musculoskeletal: He has lower back pain. ?Integumentary: No rash, pruritus, skin lesions ?Neurological: as above ?Psychiatric: No depression at this time.  No anxiety ?Endocrine: No palpitations, diaphoresis, change in appetite, change in weigh or increased thirst ?Hematologic/Lymphatic:  No anemia, purpura, petechiae. ?Allergic/Immunologic: No itchy/runny eyes, nasal congestion, recent allergic reactions, rashes ? ?ALLERGIES: ?No Known Allergies ? ?HOME MEDICATIONS: ? ?Current Outpatient Medications:  ?  diazepam (VALIUM) 5 MG tablet, Take 5-10 mg by mouth every 6 (six) hours as needed for anxiety. 1/2 tablet at 8am, 2pm and 2 tablets at bedtime, Disp: , Rfl:  ?  losartan-hydrochlorothiazide (HYZAAR) 50-12.5 MG tablet, Take 0.5 tablets by mouth daily., Disp: , Rfl:  ?  methenamine (HIPREX) 1 g tablet, Take 1 g by mouth 2 (two) times daily., Disp: , Rfl:  ?  ondansetron (ZOFRAN) 4 MG tablet, Take 4 mg by mouth every 4 (four) hours as needed for nausea/vomiting., Disp: , Rfl:  ?  OXcarbazepine (TRILEPTAL) 300 MG/5ML suspension, Take 300 mg by mouth 2 (two) times daily., Disp: , Rfl:  ?  sertraline (ZOLOFT) 50 MG tablet, Take 50 mg by mouth daily., Disp: , Rfl:  ?  solifenacin (VESICARE) 10 MG tablet, Take 10 mg by mouth daily., Disp: , Rfl:  ? ?PAST MEDICAL HISTORY: ?Past Medical History:  ?Diagnosis Date  ? Anal fissure   ? Cognitive impairment   ? Essential hypertension   ? Hyperlipidemia   ? Multiple  sclerosis ([place])   ? ? ?PAST SURGICAL HISTORY: ?None ? ?FAMILY HISTORY: ?Family History  ?Problem Relation Age of Onset  ? Heart disease Maternal Grandfather   ? Diabetes Paternal Grandfather   ? Colon cancer Paternal Grandmother   ?PHYSICAL EXAM: ? ? ?Today's Vitals  ? 01/02/22 1558  ?BP: 121/81  ?Pulse: (!) 105  ?Height: 5\' 9"  (1.753 m)  ? ? ?Body mass index is 23.77 kg/m?.  ? ? ?General: The patient is well-developed and well-nourished and in no acute distress.  He is wheelchair-bound ?  ?HEENT:  Head is [place]/AT.  Sclera are anicteric.   ? ?Skin: Extremities are without rash or  edema. ?  ?  ?Neurologic Exam ?  ?Mental status: The patient is alert .  He has reduced focus and memory.  Speech is normal. ?  ?Cranial nerves: He has bilateral INO's.. Pupils are equal, round, and reactive to light and accomodation. He has reduced vision bilaterally, able to count fingers but not read, left worse than rightl.  Facial symmetry is present. There is good facial sensation to soft touch bilaterally.Facial strength is normal.  Trapezius and sternocleidomastoid strength is normal. No dysarthria is noted.  Hearing seems normal.   ? ?Motor:  Muscle bulk is normal.   Muscle tone is increased, right greater than left, legs much greater than arms.  Due to spasticity, legs are crossed.  Strength is 4+ to 5/5 in the left arm, 4+/5 in the right arm and 4/5 finger extensors.  Strength is 2+-3/5 in the left leg and 2/5 in the proximal and distal right leg x 3/5 quads ?  ?Sensory: Sensory testing is difficult but he appears to have intact to pinprick, soft touch and vibration sensation in arms and mildly reduced vibration sensation in the legs. ?  ?Coordination: Reduced finger-nose-finger on the right worse than left ?  ?Gait and station: He is unable to stand ?  ?Reflexes: Deep tendon reflexes are increased in legs, right greater than left.  He has crossed abductors at the right knee and nonsustained clonus at the ankles   plantar  responses are extensor. ? ? ? ?IMPRESSION/PLAN ? ?Spastic diplegia ([place]) ? ?Multiple sclerosis ([place]) ? ?Seizures ([place]) ? ?Trigeminal neuralgia ? ?Urinary retention ? ? ?Plan: ?Continue Trileptal for seizures and tri

## 2022-01-03 ENCOUNTER — Encounter: Payer: Self-pay | Admitting: Neurology

## 2022-01-18 ENCOUNTER — Encounter: Payer: Self-pay | Admitting: Neurology

## 2022-01-21 ENCOUNTER — Other Ambulatory Visit: Payer: Self-pay | Admitting: Neurology

## 2022-03-04 ENCOUNTER — Encounter: Payer: Self-pay | Admitting: Neurology

## 2022-03-14 MED ORDER — BOTOX 200 UNITS IJ SOLR: 400.0000 [IU] | INTRAMUSCULAR | 3 refills | Status: DC

## 2022-03-14 NOTE — Addendum Note (Signed)
Addended by: Judi Cong on: 03/14/2022 03:35 PM   Modules accepted: Orders

## 2022-03-14 NOTE — Telephone Encounter (Signed)
Send Botox RX to [facility].

## 2022-03-14 NOTE — Telephone Encounter (Signed)
Order placed for the patient and sent to the pharmacy

## 2022-03-18 ENCOUNTER — Telehealth: Payer: Self-pay | Admitting: Neurology

## 2022-03-18 NOTE — Telephone Encounter (Signed)
[facility] [employer] [facility]) calling to verify pt's insurance for Botox.

## 2022-03-20 ENCOUNTER — Telehealth: Payer: Self-pay | Admitting: Neurology

## 2022-03-20 IMAGING — DX DG CHEST 1V PORT
1 series · 1 of 1 positions shown · non-contrast
Comparison: 07/26/2020.

CLINICAL DATA: Questionable sepsis.

EXAM:
PORTABLE CHEST 1 VIEW

[chest ap]
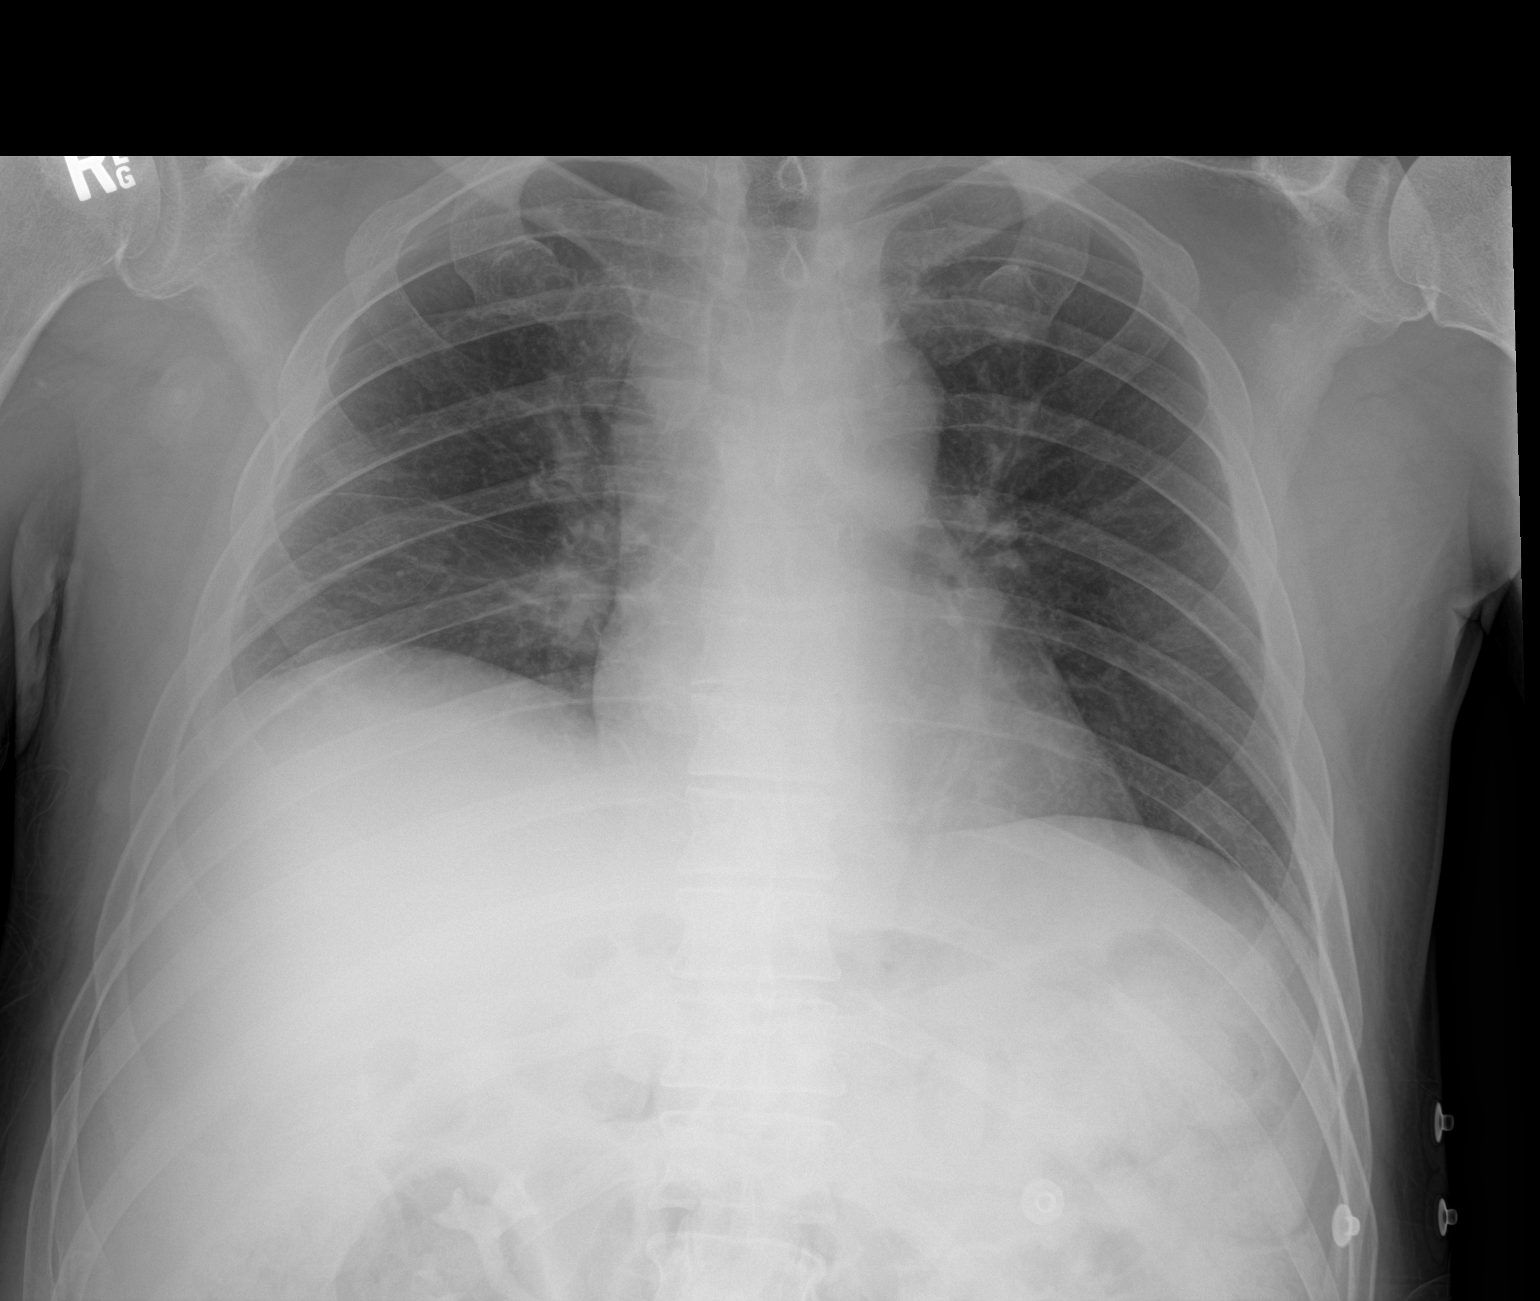

[1 of 1 positions shown; findings below may reference images not displayed]

FINDINGS: Mediastinum and hilar structures normal. Heart size normal. Low lung
volumes. Previously identified right base infiltrate noted on prior
chest x-ray of 07/26/2020 has resolved. Tiny right pleural effusion
noted. No pneumothorax. Contrast noted in the kidneys from prior
abdominal CT. No acute bony abnormality.
IMPRESSION: Low lung volumes. Previously identified right base infiltrate noted
on prior chest x-ray of 07/26/2020 has resolved. Tiny right pleural
effusion noted

## 2022-03-20 NOTE — Telephone Encounter (Signed)
[facility]/ Joni Reining Botox claim rejecting as a plan exclusion. [employer], Botox is not covered, Can not do a PA or appeal. Would like a call back.

## 2022-03-25 NOTE — Telephone Encounter (Signed)
See note 03/20/22.

## 2022-03-25 NOTE — Telephone Encounter (Signed)
I called [facility] spoke with Darl Pikes, she states they will reach out to the patients insurance company to see if pt has any other coverage.

## 2022-04-04 NOTE — Telephone Encounter (Signed)
I called [facility] spoke with [place], she states Botox is not covered under patients plan.

## 2022-04-08 ENCOUNTER — Telehealth: Payer: Self-pay | Admitting: Neurology

## 2022-04-08 ENCOUNTER — Ambulatory Visit: Payer: Medicaid Other | Admitting: Neurology

## 2022-04-16 IMAGING — DX DG CHEST 1V PORT
1 series · 1 of 1 positions shown · non-contrast
Comparison: Chest x-ray 11/21/2020

CLINICAL DATA: Questionable sepsis.

EXAM:
PORTABLE CHEST 1 VIEW.  Patient is rotated.

[chest ap]
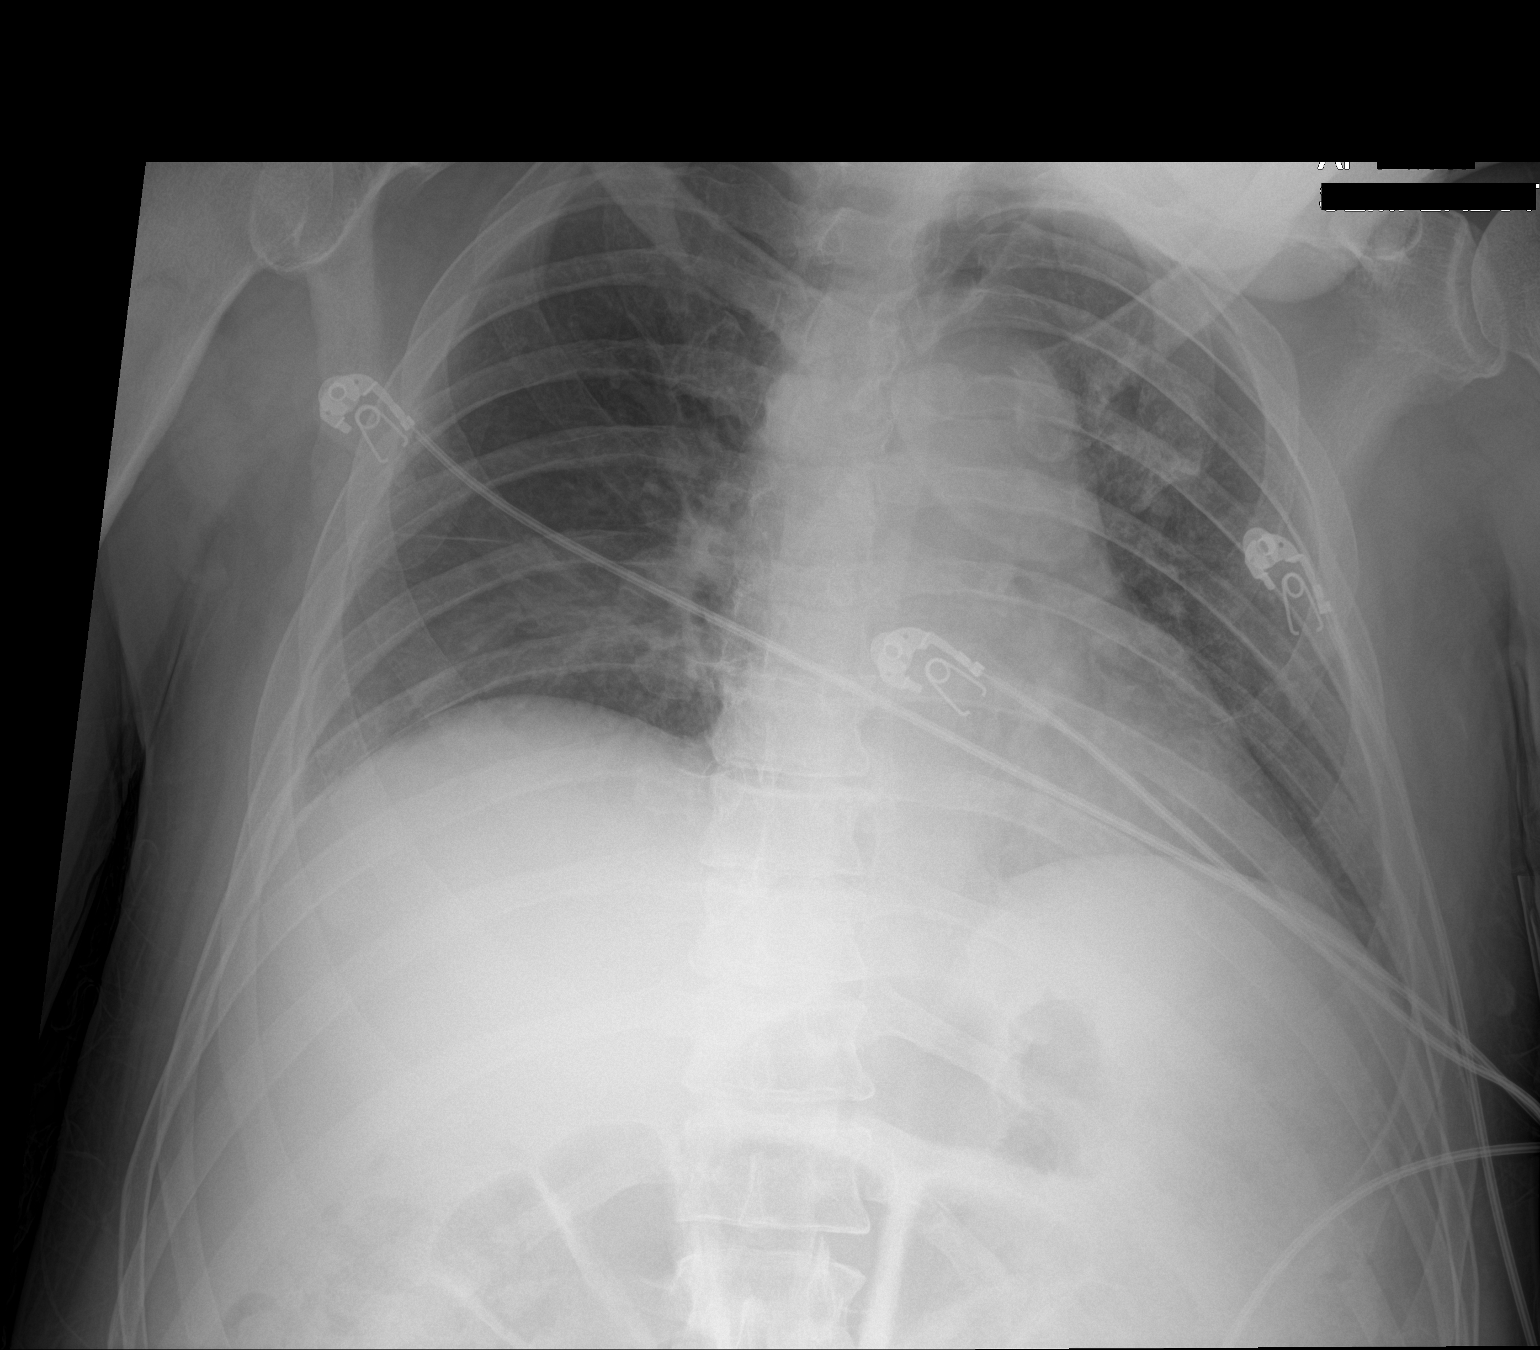

[1 of 1 positions shown; findings below may reference images not displayed]

FINDINGS: The heart size and mediastinal contours are within normal limits.

Low lung volumes with streaky airspace opacities at the bases.
Question left upper lobe patchy airspace opacity that is poorly
visualized on this study. No pulmonary edema. No pleural effusion.
No pneumothorax.

No acute osseous abnormality.
IMPRESSION: Low lung volumes with streaky airspace opacities at the bases that
likely represents atelectasis versus infection/inflammation.
Question left upper lobe patchy airspace opacity that is poorly
visualized on this study. Limited evaluation due to patient
rotation. Recommend repeat PA and lateral view of the chest.

## 2022-05-07 IMAGING — XA IR CATHETER TUBE CHANGE
1 series · 2 of 2 positions shown · non-contrast
Comparison: none

INDICATION: Advanced multiple sclerosis, chronic indwelling suprapubic catheter

[Series 300: tube placements · 2 of 2 slices shown]
[im 1/2]
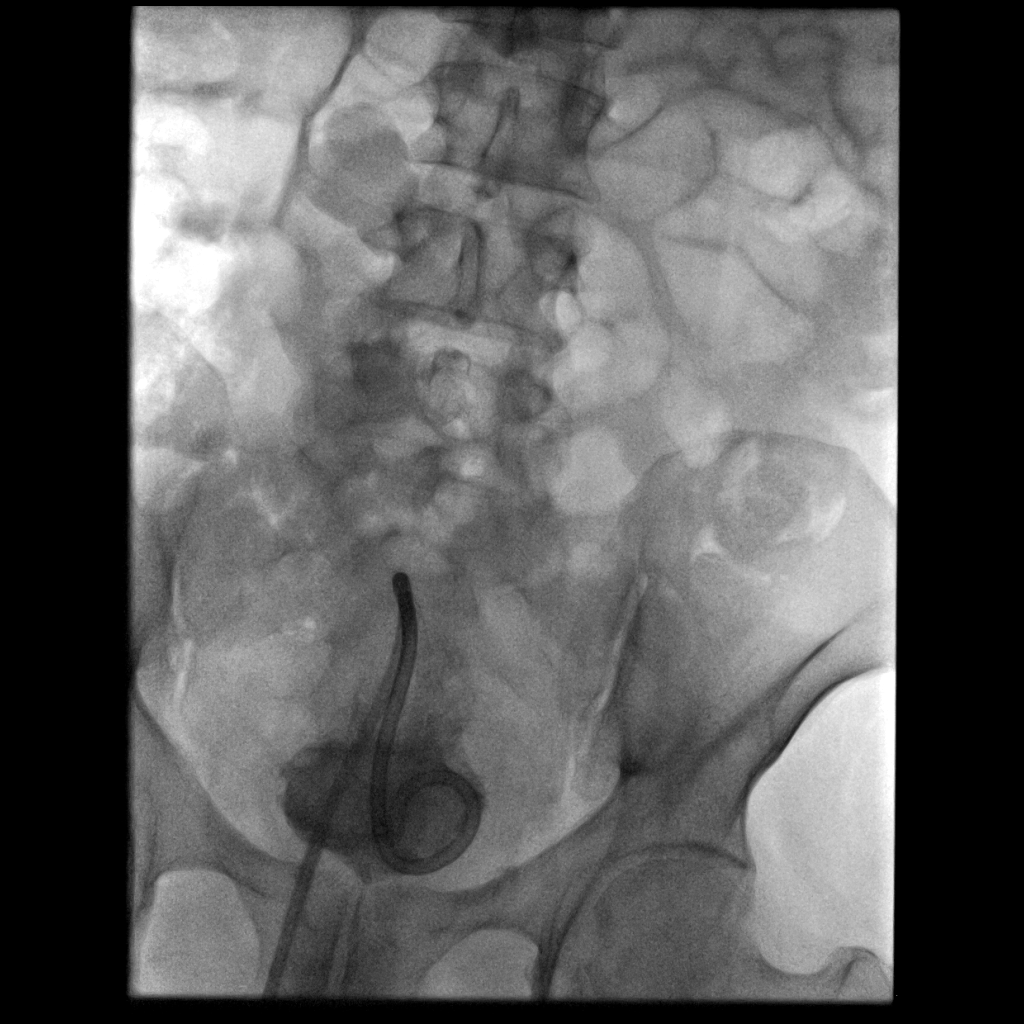
[im 2/2]
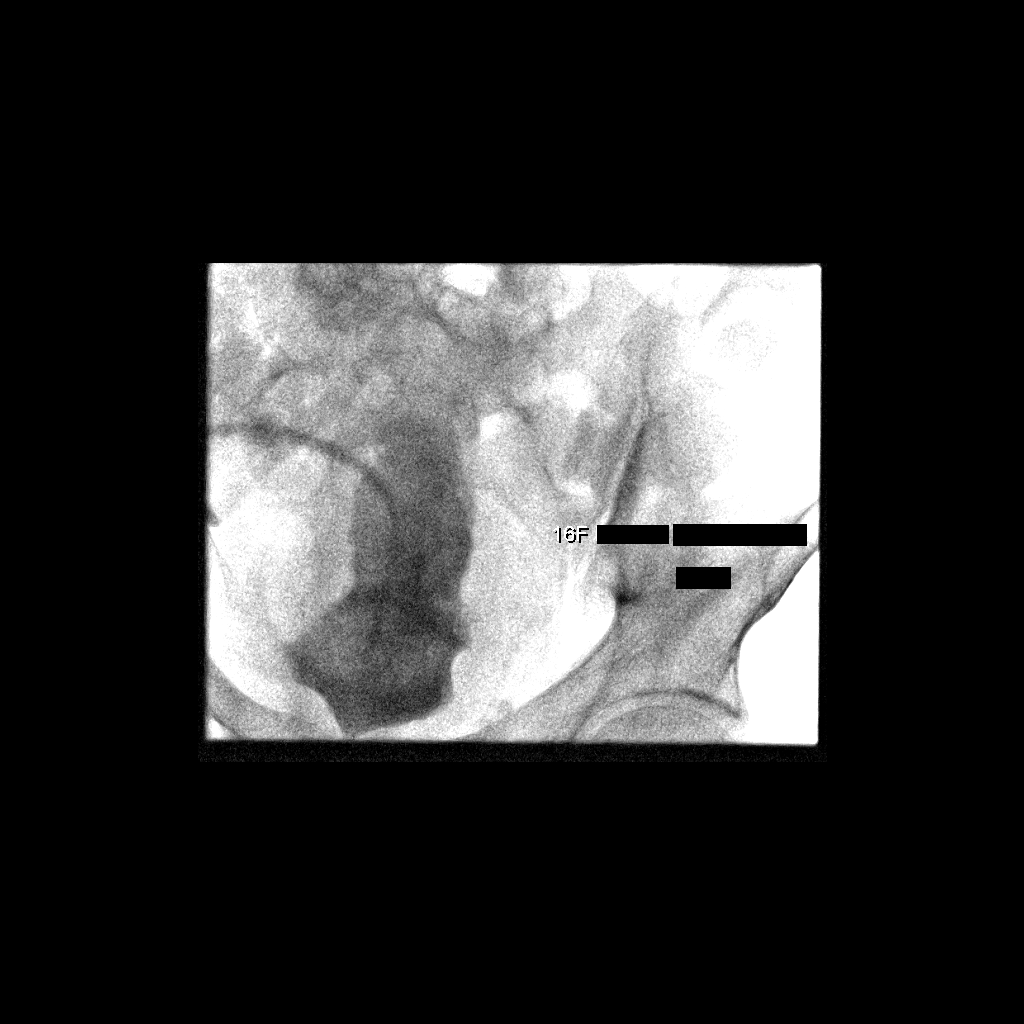

[2 of 2 positions shown; findings below may reference images not displayed]

EXAM:
FLUOROSCOPIC UPSIZE/EXCHANGE OF THE SUPRAPUBIC CATHETER

MEDICATIONS:
NONE.

ANESTHESIA/SEDATION:
Fentanyl 50 mcg IV; Versed 2.0 mg IV

Moderate Sedation Time:  10 MINUTES

The patient was continuously monitored during the procedure by the
interventional radiology nurse under my direct supervision.

COMPLICATIONS:
None immediate.

PROCEDURE:
Informed written consent was obtained from the patient after a
thorough discussion of the procedural risks, benefits and
alternatives. All questions were addressed. Maximal Sterile Barrier
Technique was utilized including caps, mask, sterile gowns, sterile
gloves, sterile drape, hand hygiene and skin antiseptic. A timeout
was performed prior to the initiation of the procedure.

under sterile conditions, the existing suprapubic catheter was
injected with contrast confirming position in the bladder. catheter
was removed over an amplatz guidewire and exchanged for a 16 french
Entuit balloon tip catheter. balloon tip inflated with 7 cc saline
containing 1 cc contrast. this was retracted against the anterior
bladder wall. position confirmed with contrast injection. images
obtained for documentation. no immediate complication. patient
tolerated the procedure well.
IMPRESSION: Successful fluoroscopic upsize of the suprapubic catheter to16
French as above

## 2022-06-06 ENCOUNTER — Other Ambulatory Visit: Payer: Self-pay | Admitting: *Deleted

## 2022-06-06 ENCOUNTER — Encounter: Payer: Self-pay | Admitting: Neurology

## 2022-06-06 DIAGNOSIS — R569 Unspecified convulsions: Secondary | ICD-10-CM

## 2022-06-06 MED ORDER — OXCARBAZEPINE 300 MG/5ML PO SUSP: 600.0000 mg | Freq: Three times a day (TID) | ORAL | 5 refills | Status: DC

## 2022-06-06 MED ORDER — OXCARBAZEPINE 300 MG/5ML PO SUSP: 450.0000 mg | Freq: Two times a day (BID) | ORAL | 5 refills | Status: DC

## 2022-06-06 NOTE — Telephone Encounter (Signed)
LVM asking pt's wife to call back and schedule EEG.

## 2022-06-10 ENCOUNTER — Telehealth: Payer: Self-pay | Admitting: Neurology

## 2022-06-10 ENCOUNTER — Encounter: Payer: Self-pay | Admitting: Neurology

## 2022-06-10 NOTE — Telephone Encounter (Signed)
Noted. Can have the patient discuss further at the upcoming apt

## 2022-06-10 NOTE — Telephone Encounter (Signed)
Pt's mother cancelled Botox appt.because have so much pain after getting injection.

## 2022-06-11 ENCOUNTER — Encounter: Payer: Self-pay | Admitting: *Deleted

## 2022-06-11 ENCOUNTER — Other Ambulatory Visit: Payer: Medicaid Other | Admitting: *Deleted

## 2022-07-03 ENCOUNTER — Ambulatory Visit: Payer: Medicaid Other | Admitting: Neurology

## 2022-08-01 ENCOUNTER — Encounter: Payer: Self-pay | Admitting: Neurology

## 2022-08-01 ENCOUNTER — Ambulatory Visit: Payer: Medicaid Other | Admitting: Neurology

## 2022-08-01 VITALS — BP 135/104 | HR 88

## 2022-08-01 DIAGNOSIS — R569 Unspecified convulsions: Secondary | ICD-10-CM

## 2022-08-01 DIAGNOSIS — G35 Multiple sclerosis: Secondary | ICD-10-CM

## 2022-08-01 DIAGNOSIS — R1310 Dysphagia, unspecified: Secondary | ICD-10-CM

## 2022-08-01 DIAGNOSIS — G801 Spastic diplegic cerebral palsy: Secondary | ICD-10-CM

## 2022-08-01 DIAGNOSIS — G500 Trigeminal neuralgia: Secondary | ICD-10-CM | POA: Diagnosis not present

## 2022-08-01 DIAGNOSIS — R269 Unspecified abnormalities of gait and mobility: Secondary | ICD-10-CM

## 2022-08-01 NOTE — Progress Notes (Signed)
[facility]  PATIENT: Dennis Mann DOB: 1976/08/14  REFERRING DOCTOR OR PCP: Imogene Burn, NP; Crist Fat, MD SOURCE: Patient, notes from [place] [facility]  _________________________________   HISTORICAL  CHIEF COMPLAINT:  Chief Complaint  Patient presents with   Follow-up    Rm 1, mother, pt under hospice palliative care for a year.  Has declined.  Hard for mother to bring to appointments.     Dennis Mann is a 46 y.o. man with MS and seizures.  History of Present Illness: Update 08/01/2022 He has declined further.   He has no appetite and has some swallow difficulty so has lost weight.      He is not on a DMT.   No exacerbations but more decline.  He is wheelchair bound.  He is unable to assist with transfers or bear any weight.  He spends most of the day in bed.   They have a Nurse, adult.  His arms are strong though he has reduced coordination, especially in the left     He has severe spasticity in the legs.    He is having more pain in the legs.  Additionally, due to the spasticity has become more difficult for his caregivers to do some of the care or put on socks.  Baclofen was not helping so they stopped.     We had done Botox and he did better initially but it did not help long enough for him to want to continue.  Legs are adducted.   Travel is difficulty (has been home bound x 6 months).   BP is elevated.      He has no recent pneumonia or other infection but had aspiration pneumonia x 3 the preceding    He became delirious when he had the infections.    Due to swallowing difficulties he has needed to switch to purred food.  He has palliative care with hospice and gets nurse visits twice a week.    He has a suprapubic tube.       He has cogitive dysfunction and sundowns some evenings, even when not sick.  If his mom is next to him, he does better.   He is very confused and sees thongs other people do not.   Valium helps some.   He has trouble  with time and thinks memories from distant past are current.     He has some trigeminal neuralgia but is not complaining more than in past.   Oxcarbazepine has helped.    He has not had more seizures since the last visit.      MS History : He was diagnosed with MS diagnosed at age 71 in 51 after presenting with diplopia that developed overnight.  His mother noted the eyes were not moving correctly and he went to the emergency room.  A CT in the ED showed abnormality and a follow up MRI was consistent with MS. CSF reportedly showed abnormalities consistent with MS.  He received IV Solumedrol and was started on Betaseron.   He had breakthrough exacerbations including severe ones with right sided weakness and partial blindness.   He was on Copaxone for a while but had breakthrough exacerbations.   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    He switched to Ocrevus most recently from 2018 to May 2020.  He continued to progress and had his last treatment May 2020.    Mayzent was discussed but they  opted not to proceed.   Earlier this year, he moved from [place] (was living with his father) to [place] (to live with mother)  He was stable until early November 2021 when he had an influenza vaccination.  Later that day, he could not move and became less responsive over the next couple of days.Marland Kitchen   He was taken to [facility] and diagnosed with pneumonia.   Although his mental status has returned to normal, he continues to be weaker than he was before the pneumonia.     He was able to transfer with assistance but now a Michiel Sites lift is needd   EMS was able to use a walker around 2017.    Images reviewed: MRI of the brain 12/03/2017 and 09/01/2019 showed moderate generalized cortical atrophy.  There are multiple T2/FLAIR hyperintense foci in the periventricular, juxtacortical deep white matter of the hemispheres and in the midbrain, pons, middle cerebellar peduncles and medulla.   There were no enhancing lesions.  MRI of the cervical spine 12/03/2017 and 09/01/2019 showed movement artifact.  In this context, there are patchy T2 hyperintense foci.  Most prominent foci are at C1, C2 and C6 and T1 and T2 there is also moderately severe spinal stenosis at C6-C7 due to a left paramedian disc protrusion and mild to moderate spinal stenosis at C3-C4 and C4-C5 and C5C6.  There is no abnormal enhancement.  REVIEW OF SYSTEMS: Constitutional: No fevers, chills, sweats, or change in appetite. Eyes: No visual changes,eye pain.  He has diplopia Ear, nose and throat: No hearing loss, ear pain, nasal congestion, sore throat Cardiovascular: No chest pain, palpitations Respiratory:  No shortness of breath at rest or with exertion.   No wheezes GastrointestinaI: No nausea, vomiting, diarrhea, abdominal pain, fecal incontinence Genitourinary: Incontinence as above Musculoskeletal: He has lower back pain. Integumentary: No rash, pruritus, skin lesions Neurological: as above Psychiatric: No depression at this time.  No anxiety Endocrine: No palpitations, diaphoresis, change in appetite, change in weigh or increased thirst Hematologic/Lymphatic:  No anemia, purpura, petechiae. Allergic/Immunologic: No itchy/runny eyes, nasal congestion, recent allergic reactions, rashes  ALLERGIES: No Known Allergies  HOME MEDICATIONS:  Current Outpatient Medications:    diazepam (VALIUM) 5 MG tablet, Take 5-10 mg by mouth every 6 (six) hours as needed for anxiety. 1/2 tablet at 8am, 2pm and 2 tablets at bedtime, Disp: , Rfl:    methenamine (HIPREX) 1 g tablet, Take 1 g by mouth 2 (two) times daily., Disp: , Rfl:    ondansetron (ZOFRAN) 4 MG tablet, Take 4 mg by mouth every 4 (four) hours as needed for nausea/vomiting., Disp: , Rfl:    OXcarbazepine (TRILEPTAL) 300 MG/5ML suspension, Take 10 mLs (600 mg total) by mouth in the morning, at noon, and at bedtime., Disp: 900 mL, Rfl: 5   solifenacin  (VESICARE) 10 MG tablet, Take 10 mg by mouth daily., Disp: , Rfl:    losartan-hydrochlorothiazide (HYZAAR) 50-12.5 MG tablet, Take 0.5 tablets by mouth daily. (Patient not taking: Reported on 08/01/2022), Disp: , Rfl:   PAST MEDICAL HISTORY: Past Medical History:  Diagnosis Date   Anal fissure    Cognitive impairment    Essential hypertension    Hyperlipidemia    Multiple sclerosis (HCC)     PAST SURGICAL HISTORY: None  FAMILY HISTORY: Family History  Problem Relation Age of Onset   Heart disease Maternal Grandfather    Colon cancer Paternal Grandmother    Diabetes Paternal Grandfather   PHYSICAL EXAM:  Today's Vitals   08/01/22 1545  BP: (!) 135/104  Pulse: 88    There is no height or weight on file to calculate BMI.    General: The patient is well-developed and well-nourished and in no acute distress.  He is wheelchair-bound   HEENT:  Head is [place]/AT.  Sclera are anicteric.    Skin: Extremities are without rash or  edema.     Neurologic Exam   Mental status: The patient is alert .  He has reduced focus and memory.   Speech is normal.   Cranial nerves: He has bilateral INO's.. Pupils are equal, round, and reactive to light and accomodation. He has reduced vision bilaterally, able to count fingers but not read, left worse than rightl.  Facial symmetry is present. There is good facial sensation to soft touch bilaterally.Facial strength is normal.  No dysarthria is noted.  Hearing seems normal.    Motor:  Muscle bulk is normal.   Muscle tone is increased, right greater than left, legs much greater than arms.  Due to spasticity, legs are crossed.  Strength is 4+ to 5/5 in the left arm, 4 to 4+/5 in the right arm .  Strength is 2+-3/5 in the left leg and 1 to -/5 in the proximal and distal right leg    Sensory: Sensory testing is difficult but he appears to have intact to pinprick, soft touch and vibration sensation in arms and mildly reduced vibration sensation in the  legs.   Coordination: Finger-nose-finger is mildly reduced on the left and moderately reduced on the right.  He cannot do heel-to-shin  Gait and station: He is unable to stand   Reflexes: Deep tendon reflexes are increased in legs, right greater than left.  He has crossed abductors at the right knee and nonsustained clonus at the ankles   plantar responses are extensor.    IMPRESSION/PLAN  Multiple sclerosis (HCC)  Spastic diplegia (HCC)  Seizures (HCC)  Trigeminal neuralgia  Dysphagia, unspecified type  Gait disturbance  As he did not get much benefit from the Botox we will not do another round.  Remain off of disease modifying therapy.  He had no benefit from Ocrevus. Stay active.  Try to eat well.  If swallowing worsens we will get a speech swallow evaluation. He had been on a blood pressure medicine and his diastolic is elevated.  I recommend that he go back on. Return in 6 months.  This visit can be virtual if travel is difficult.  40-minute office visit with the majority of the time spent face-to-face for history and physical, discussion/counseling and decision-making.  Additional time with record review and documentation.   Elita Dame A. Epimenio Foot, MD, [facility] 08/01/2022, 8:38 PM Certified in Neurology, Clinical Neurophysiology, Sleep Medicine and Neuroimaging  [facility] [address] [place], [place] [address] [phone]

## 2023-01-22 ENCOUNTER — Encounter: Payer: Self-pay | Admitting: Neurology

## 2023-02-06 ENCOUNTER — Telehealth (INDEPENDENT_AMBULATORY_CARE_PROVIDER_SITE_OTHER): Payer: Medicaid Other | Admitting: Neurology

## 2023-02-06 ENCOUNTER — Encounter: Payer: Self-pay | Admitting: Neurology

## 2023-02-06 DIAGNOSIS — G801 Spastic diplegic cerebral palsy: Secondary | ICD-10-CM | POA: Diagnosis not present

## 2023-02-06 DIAGNOSIS — R569 Unspecified convulsions: Secondary | ICD-10-CM

## 2023-02-06 DIAGNOSIS — G500 Trigeminal neuralgia: Secondary | ICD-10-CM

## 2023-02-06 DIAGNOSIS — R4189 Other symptoms and signs involving cognitive functions and awareness: Secondary | ICD-10-CM

## 2023-02-06 DIAGNOSIS — G35 Multiple sclerosis: Secondary | ICD-10-CM

## 2023-02-06 DIAGNOSIS — R339 Retention of urine, unspecified: Secondary | ICD-10-CM

## 2023-02-06 DIAGNOSIS — R1310 Dysphagia, unspecified: Secondary | ICD-10-CM

## 2023-02-06 NOTE — Progress Notes (Signed)
[facility]  PATIENT: Ngoc Malla DOB: 1976/06/03  REFERRING DOCTOR OR PCP: Imogene Burn, NP; Crist Fat, MD SOURCE: Patient, notes from [place] [facility]  _________________________________   HISTORICAL  CHIEF COMPLAINT:  Sambo Bassano is a 47 y.o. man with MS and seizures  Virtual Visit via Video Note I connected with Selina Cooley on 02/06/23 at  3:00 PM EDT by a video enabled telemedicine application and verified that I am speaking with the correct person.  I discussed the limitations of evaluation and management by telemedicine and the availability of in person appointments. The patient expressed understanding and agreed to proceed.  Patient at home with mother; provider in office.    Marland Kitchen  History of Present Illness: Update 02/06/2023 He has declined further.   He lives with his mother.    He has severe right sided weakness and spasticity.  Has milder left leg weakness/spasticity.    Botox did not help enough (mildly for just a couple weeks) the first time so he did not want to try a higher dose a second time.    The right leg is locked under him.   He has some pain at times and takes Tylenol  He has some swallow difficulty so has lost weight.   He has trouble handling secretions at times and they have a suction machine He had a couple hospitalization for aspiration pneumonia.   He has not had recent UTI (has suprapubic).  He is on methenamine.  He has constipation.     He has sparse speech and severe cognitive issues.    He has a good demeanor.   STM and processing are both poor,     He is bed bound.  He is unable to assist with transfers or bear any weight.  They have a Hoyer lift but due to right leg spasticity they rarely leave bed.   His arms are strong though he has reduced coordination, especially in the left     He has palliative care with hospice and gets nurse visits twice a week.    He has a suprapubic tube.       He has cogitive  dysfunction .  Sometimes in the evening he is more confused and wants to do things he is unable to do.  No severe sundowning.    If his mom is next to him, he does better.    Valium helps some.   He has trouble with time and thinks memories from distant past are current.     He had trigeminal neuralgia but is not complaining recently.   Oxcarbazepine has helped.    He has not had GTC seizures > 1 year but has transient alteration of awareness when he does not respond to his mom and eyes seem to twitch though unclear if related to cognition or seizure,.   He os on oxcarbazepine.    MS History : He was diagnosed with MS diagnosed at age 42 in 60 after presenting with diplopia that developed overnight.  His mother noted the eyes were not moving correctly and he went to the emergency room.  A CT in the ED showed abnormality and a follow up MRI was consistent with MS. CSF reportedly showed abnormalities consistent with MS.  He received IV Solumedrol and was started on Betaseron.   He had breakthrough exacerbations including severe ones with right sided weakness and partial blindness.   He was on Copaxone for a while but had breakthrough  exacerbations.   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    He switched to Ocrevus most recently from 2018 to May 2020.  He continued to progress and had his last treatment May 2020.    Mayzent was discussed but they opted not to proceed.   In 2021, he moved from [place] (was living with his father) to [place] (to live with mother)  He was stable until early November 2021 when he had an influenza vaccination.  Later that day, he could not move and became less responsive over the next couple of days.Marland Kitchen   He was taken to [facility] and diagnosed with pneumonia.   Although his mental status has returned to normal, he continues to be weaker than he was before the pneumonia.    In 2017 he could transfer but soon became wheelchair/bed dependent.       Images reviewed: MRI of the brain 12/03/2017 and 09/01/2019 showed moderate generalized cortical atrophy.  There are multiple T2/FLAIR hyperintense foci in the periventricular, juxtacortical deep white matter of the hemispheres and in the midbrain, pons, middle cerebellar peduncles and medulla.  There were no enhancing lesions.  MRI of the cervical spine 12/03/2017 and 09/01/2019 showed movement artifact.  In this context, there are patchy T2 hyperintense foci.  Most prominent foci are at C1, C2 and C6 and T1 and T2 there is also moderately severe spinal stenosis at C6-C7 due to a left paramedian disc protrusion and mild to moderate spinal stenosis at C3-C4 and C4-C5 and C5C6.  There is no abnormal enhancement.  REVIEW OF SYSTEMS: Constitutional: No fevers, chills, sweats, or change in appetite. Eyes: No visual changes,eye pain.  He has diplopia Ear, nose and throat: No hearing loss, ear pain, nasal congestion, sore throat Cardiovascular: No chest pain, palpitations Respiratory:  No shortness of breath at rest or with exertion.   No wheezes GastrointestinaI: No nausea, vomiting, diarrhea, abdominal pain, fecal incontinence Genitourinary: Incontinence as above Musculoskeletal: He has lower back pain. Integumentary: No rash, pruritus, skin lesions Neurological: as above Psychiatric: No depression at this time.  No anxiety Endocrine: No palpitations, diaphoresis, change in appetite, change in weigh or increased thirst Hematologic/Lymphatic:  No anemia, purpura, petechiae. Allergic/Immunologic: No itchy/runny eyes, nasal congestion, recent allergic reactions, rashes  ALLERGIES: No Known Allergies  HOME MEDICATIONS:  Current Outpatient Medications:    diazepam (VALIUM) 5 MG tablet, Take 5-10 mg by mouth every 6 (six) hours as needed for anxiety. 1/2 tablet at 8am, 2pm and 2 tablets at bedtime, Disp: , Rfl:    losartan-hydrochlorothiazide (HYZAAR) 50-12.5 MG tablet, Take 0.5 tablets by mouth  daily. (Patient not taking: Reported on 08/01/2022), Disp: , Rfl:    methenamine (HIPREX) 1 g tablet, Take 1 g by mouth 2 (two) times daily., Disp: , Rfl:    ondansetron (ZOFRAN) 4 MG tablet, Take 4 mg by mouth every 4 (four) hours as needed for nausea/vomiting., Disp: , Rfl:    OXcarbazepine (TRILEPTAL) 300 MG/5ML suspension, Take 10 mLs (600 mg total) by mouth in the morning, at noon, and at bedtime., Disp: 900 mL, Rfl: 5   solifenacin (VESICARE) 10 MG tablet, Take 10 mg by mouth daily., Disp: , Rfl:   PAST MEDICAL HISTORY: Past Medical History:  Diagnosis Date   Anal fissure    Cognitive impairment    Essential hypertension    Hyperlipidemia    Multiple sclerosis (HCC)     PAST SURGICAL HISTORY: None  FAMILY HISTORY:  Family History  Problem Relation Age of Onset   Heart disease Maternal Grandfather    Colon cancer Paternal Grandmother    Diabetes Paternal Grandfather     PHYSICAL EXAM (from 08/01/2022)   There were no vitals filed for this visit.   There is no height or weight on file to calculate BMI.    General: The patient is well-developed and well-nourished and in no acute distress.  He is wheelchair-bound   HEENT:  Head is [place]/AT.  Sclera are anicteric.    Skin: Extremities are without rash or  edema.     Neurologic Exam   Mental status: The patient is alert .  He has reduced focus and memory.   Speech is normal.   Cranial nerves: He has bilateral INO's.. Pupils are equal, round, and reactive to light and accomodation. He has reduced vision bilaterally, able to count fingers but not read, left worse than rightl.  Facial symmetry is present. There is good facial sensation to soft touch bilaterally.Facial strength is normal.  No dysarthria is noted.  Hearing seems normal.    Motor:  Muscle bulk is normal.   Muscle tone is increased, right greater than left, legs much greater than arms.  Due to spasticity, legs are crossed.  Strength is 4+ to 5/5 in the left arm,  4 to 4+/5 in the right arm .  Strength is 2+-3/5 in the left leg and 1 to -/5 in the proximal and distal right leg    Sensory: Sensory testing is difficult but he appears to have intact to pinprick, soft touch and vibration sensation in arms and mildly reduced vibration sensation in the legs.   Coordination: Finger-nose-finger is mildly reduced on the left and moderately reduced on the right.  He cannot do heel-to-shin  Gait and station: He is unable to stand   Reflexes: Deep tendon reflexes are increased in legs, right greater than left.  He has crossed abductors at the right knee and nonsustained clonus at the ankles   plantar responses are extensor.    IMPRESSION/PLAN  Multiple sclerosis (HCC)  Spastic diplegia (HCC)  Seizures (HCC)  Trigeminal neuralgia  Dysphagia, unspecified type  Urinary retention  Cognitive impairment  Remain off of disease modifying therapy.  He had no benefit from Ocrevus. Stay active.  Try to eat more. Remain off BP meds as no longer elevated after  Return in 6 months.  This visit can be virtual if travel is difficult.   Follow Up Instructions: I discussed the assessment and treatment plan with the patient. The patient was provided an opportunity to ask questions and all were answered. The patient agreed with the plan and demonstrated an understanding of the instructions.    The patient was advised to call back or seek an in-person evaluation if the symptoms worsen or if the condition fails to improve as anticipated.  I provided 25 minutes of non-face-to-face time during this encounte  Tyniya Kuyper A. Epimenio Foot, MD, [facility] 02/06/2023, 3:28 PM Certified in Neurology, Clinical Neurophysiology, Sleep Medicine and Neuroimaging  [facility] [address] [place], [place] [address] [phone]

## 2023-02-12 ENCOUNTER — Telehealth: Payer: Self-pay | Admitting: Neurology

## 2023-02-12 ENCOUNTER — Encounter: Payer: Self-pay | Admitting: Neurology

## 2023-02-12 NOTE — Telephone Encounter (Signed)
Follow up scheduled with Dr. Epimenio Foot for 08/25/23 at 11:30am

## 2023-08-25 ENCOUNTER — Encounter: Payer: Self-pay | Admitting: Neurology

## 2023-08-25 ENCOUNTER — Telehealth (INDEPENDENT_AMBULATORY_CARE_PROVIDER_SITE_OTHER): Payer: Medicaid Other | Admitting: Neurology

## 2023-08-25 DIAGNOSIS — Z7401 Bed confinement status: Secondary | ICD-10-CM

## 2023-08-25 DIAGNOSIS — G35 Multiple sclerosis: Secondary | ICD-10-CM

## 2023-08-25 DIAGNOSIS — Z515 Encounter for palliative care: Secondary | ICD-10-CM

## 2023-08-25 DIAGNOSIS — G801 Spastic diplegic cerebral palsy: Secondary | ICD-10-CM

## 2023-08-25 DIAGNOSIS — R4189 Other symptoms and signs involving cognitive functions and awareness: Secondary | ICD-10-CM

## 2023-08-25 DIAGNOSIS — R1310 Dysphagia, unspecified: Secondary | ICD-10-CM

## 2023-08-25 DIAGNOSIS — R569 Unspecified convulsions: Secondary | ICD-10-CM | POA: Diagnosis not present

## 2023-08-25 DIAGNOSIS — G500 Trigeminal neuralgia: Secondary | ICD-10-CM | POA: Diagnosis not present

## 2023-08-25 NOTE — Progress Notes (Signed)
[facility]  PATIENT: Dennis Mann DOB: [birth year]  REFERRING DOCTOR OR PCP: Imogene Burn, NP; Crist Fat, MD SOURCE: Patient, notes from [place] [facility]  _________________________________   HISTORICAL  CHIEF COMPLAINT:  Dennis Mann is a 47 y.o. man with MS and seizures  Virtual Visit via Video Note I connected with Dennis Mann on 08/25/23 at 11:30 AM EST by a video enabled telemedicine application and verified that I am speaking with the correct person.  I discussed the limitations of evaluation and management by telemedicine and the availability of in person appointments. The patient expressed understanding and agreed to proceed.  Patient at home with mother; provider in office.  Marland Kitchen  History of Present Illness: Update 02/06/2023 He has declined further the last couple months.   He lives with his mother.  He has become more nonverbal and only gets one word out like yes/no.   He understands well.   He has quadriparesis,.   He has severe right sided weakness and spasticity.  Has milder left leg weakness/spasticity.    Botox did not help enough (mildly for just a couple weeks) the first time so he did not want to try a higher dose a second time.    The right leg is locked under him.  They try to stretch some.    He has some pain at times and takes Tylenol  He has poor swallowing and just eats pudding consistency / pureed food.  He He does not want a feeding tube.   He has trouble handling secretions at times and they have a suction machine.  He had a couple hospitalization for aspiration pneumonia.   He has not had recent UTI (has suprapubic).  He is on methenamine.  He has constipation.     He takes 10 mg valium at night and sleeps 7-8 hours.  He has a good demeanor.   STM and processing are both poor,   Sometimes in the evening he is more confused and agitated.  No severe sundowning.  His mom usually can calm him down and valium also helps.   Valium also  helps his sleep   He is bed bound.  He is unable to assist with transfers or bear any weight.  They have a Hoyer lift but due to right leg spasticity they rarely leave bed.   His arms are strong though he has reduced coordination, especially in the left     He has palliative care with hospice and gets nurse visits twice a week.    He has a suprapubic tube.     His mom flushes it daily  He has not had GTC seizures >  2 year .  He is on oxcarbazepine.  He had trigeminal neuralgia but is not complaining recently.   Oxcarbazepine has helped.    PE: He is bedbound.  The head is normocephalic and atraumatic.  Sclera are anicteric.  Visible skin appears normal.  The neck has a good range of motion.    He is alert and can respond simple questions.  He has bilateral INO.  Facial strength seems normal. Right > left weakness.  Leg is contracted   MS History : He was diagnosed with MS diagnosed at age 77 in 7 after presenting with diplopia that developed overnight.  His mother noted the eyes were not moving correctly and he went to the emergency room.  A CT in the ED showed abnormality and a follow up MRI was  consistent with MS. CSF reportedly showed abnormalities consistent with MS.  He received IV Solumedrol and was started on Betaseron.   He had breakthrough exacerbations including severe ones with right sided weakness and partial blindness.   He was on Copaxone for a while but had breakthrough exacerbations.   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    He switched to Ocrevus most recently from 2018 to May 2020.  He continued to progress and had his last treatment May 2020.    Mayzent was discussed but they opted not to proceed.   In 2021, he moved from [place] (was living with his father) to [place] (to live with mother)  He was stable until early November 2021 when he had an influenza vaccination.  Later that day, he could not move and became less responsive over the next couple of  days.Marland Kitchen   He was taken to [facility] and diagnosed with pneumonia.   Although his mental status has returned to normal, he continues to be weaker than he was before the pneumonia.    In 2017 he could transfer but soon became wheelchair/bed dependent.      Images reviewed: MRI of the brain 12/03/2017 and 09/01/2019 showed moderate generalized cortical atrophy.  There are multiple T2/FLAIR hyperintense foci in the periventricular, juxtacortical deep white matter of the hemispheres and in the midbrain, pons, middle cerebellar peduncles and medulla.  There were no enhancing lesions.  MRI of the cervical spine 12/03/2017 and 09/01/2019 showed movement artifact.  In this context, there are patchy T2 hyperintense foci.  Most prominent foci are at C1, C2 and C6 and T1 and T2 there is also moderately severe spinal stenosis at C6-C7 due to a left paramedian disc protrusion and mild to moderate spinal stenosis at C3-C4 and C4-C5 and C5C6.  There is no abnormal enhancement.  REVIEW OF SYSTEMS: Constitutional: No fevers, chills, sweats, or change in appetite. Eyes: No visual changes,eye pain.  He has diplopia Ear, nose and throat: No hearing loss, ear pain, nasal congestion, sore throat Cardiovascular: No chest pain, palpitations Respiratory:  No shortness of breath at rest or with exertion.   No wheezes GastrointestinaI: No nausea, vomiting, diarrhea, abdominal pain, fecal incontinence Genitourinary: Incontinence as above Musculoskeletal: He has lower back pain. Integumentary: No rash, pruritus, skin lesions Neurological: as above Psychiatric: No depression at this time.  No anxiety Endocrine: No palpitations, diaphoresis, change in appetite, change in weigh or increased thirst Hematologic/Lymphatic:  No anemia, purpura, petechiae. Allergic/Immunologic: No itchy/runny eyes, nasal congestion, recent allergic reactions, rashes  ALLERGIES: No Known Allergies  HOME MEDICATIONS:  Current Outpatient  Medications:    diazepam (VALIUM) 5 MG tablet, Take 5-10 mg by mouth every 6 (six) hours as needed for anxiety. 1/2 tablet at 8am, 2pm and 2 tablets at bedtime, Disp: , Rfl:    methenamine (HIPREX) 1 g tablet, Take 1 g by mouth 2 (two) times daily., Disp: , Rfl:    ondansetron (ZOFRAN) 4 MG tablet, Take 4 mg by mouth every 4 (four) hours as needed for nausea/vomiting., Disp: , Rfl:    OXcarbazepine (TRILEPTAL) 300 MG/5ML suspension, Take 10 mLs (600 mg total) by mouth in the morning, at noon, and at bedtime., Disp: 900 mL, Rfl: 5   solifenacin (VESICARE) 10 MG tablet, Take 10 mg by mouth daily., Disp: , Rfl:   PAST MEDICAL HISTORY: Past Medical History:  Diagnosis Date   Anal fissure    Cognitive impairment  Essential hypertension    Hyperlipidemia    Multiple sclerosis (HCC)     PAST SURGICAL HISTORY: None  FAMILY HISTORY: Family History  Problem Relation Age of Onset   Heart disease Maternal Grandfather    Colon cancer Paternal Grandmother    Diabetes Paternal Grandfather     PHYSICAL EXAM (from 08/01/2022)   There were no vitals filed for this visit.   There is no height or weight on file to calculate BMI.    General: The patient is well-developed and well-nourished and in no acute distress.  He is wheelchair-bound   HEENT:  Head is [place]/AT.  Sclera are anicteric.    Skin: Extremities are without rash or  edema.     Neurologic Exam   Mental status: The patient is alert .  He has reduced focus and memory.   Speech is normal.   Cranial nerves: He has bilateral INO's.. Pupils are equal, round, and reactive to light and accomodation. He has reduced vision bilaterally, able to count fingers but not read, left worse than rightl.  Facial symmetry is present. There is good facial sensation to soft touch bilaterally.Facial strength is normal.  No dysarthria is noted.  Hearing seems normal.    Motor:  Muscle bulk is normal.   Muscle tone is increased, right greater than  left, legs much greater than arms.  Due to spasticity, legs are crossed.  Strength is 4+ to 5/5 in the left arm, 4 to 4+/5 in the right arm .  Strength is 2+-3/5 in the left leg and 1 to -/5 in the proximal and distal right leg    Sensory: Sensory testing is difficult but he appears to have intact to pinprick, soft touch and vibration sensation in arms and mildly reduced vibration sensation in the legs.   Coordination: Finger-nose-finger is mildly reduced on the left and moderately reduced on the right.  He cannot do heel-to-shin  Gait and station: He is unable to stand   Reflexes: Deep tendon reflexes are increased in legs, right greater than left.  He has crossed abductors at the right knee and nonsustained clonus at the ankles   plantar responses are extensor.    IMPRESSION/PLAN  Multiple sclerosis (HCC)  Spastic diplegia (HCC)  Seizures (HCC)  Trigeminal neuralgia  Dysphagia, unspecified type  Cognitive impairment  Bedbound  Palliative care status  He will remain off of disease modifying therapy.  He had no benefit from Ocrevus and ID risks likely higher than any benefit. Stay active. Continue pureed diet. Oxcarbazepine for seizures and trigeminal neuralgia.   Valium for sleep, agitation Return in 6 months.  This visit can be virtual if travel is difficult.   Follow Up Instructions: I discussed the assessment and treatment plan with the patient. The patient was provided an opportunity to ask questions and all were answered. The patient agreed with the plan and demonstrated an understanding of the instructions.    The patient was advised to call back or seek an in-person evaluation if the symptoms worsen or if the condition fails to improve as anticipated.  I provided 25 minutes of non-face-to-face time during this encounte  Dennis Mann A. Epimenio Foot, MD, [facility] 08/25/2023, 12:01 PM Certified in Neurology, Clinical Neurophysiology, Sleep Medicine and Neuroimaging  [facility] [address], [address] [place], [place] [address] [phone]

## 2024-01-24 ENCOUNTER — Encounter: Payer: Self-pay | Admitting: Neurology

## 2024-03-03 ENCOUNTER — Encounter: Payer: Self-pay | Admitting: Neurology

## 2024-03-04 ENCOUNTER — Telehealth: Payer: Medicaid Other | Admitting: Neurology

## 2024-03-05 ENCOUNTER — Other Ambulatory Visit: Payer: Self-pay

## 2024-03-05 ENCOUNTER — Emergency Department (HOSPITAL_COMMUNITY)
Admission: EM | Admit: 2024-03-05 | Discharge: 2024-03-06 | Disposition: A | Discharge status: Home / Self Care | Attending: Emergency Medicine | Admitting: Emergency Medicine

## 2024-03-05 ENCOUNTER — Encounter (HOSPITAL_COMMUNITY): Payer: Self-pay | Admitting: Emergency Medicine

## 2024-03-05 DIAGNOSIS — I10 Essential (primary) hypertension: Secondary | ICD-10-CM | POA: Insufficient documentation

## 2024-03-05 DIAGNOSIS — Z466 Encounter for fitting and adjustment of urinary device: Secondary | ICD-10-CM | POA: Diagnosis present

## 2024-03-05 DIAGNOSIS — Z435 Encounter for attention to cystostomy: Secondary | ICD-10-CM

## 2024-03-05 NOTE — ED Provider Notes (Signed)
 [facility] EMERGENCY DEPARTMENT AT [facility] Provider Note   CSN: [id number] Arrival date & time: 03/05/24  1733     Patient presents with: Abdominal Pain (displacement)   Dennis Mann is a 48 y.o. male.   HPI Patient gets regular catheter replacements monthly.  Patient's mother is his primary care giver.  She reports that she and his nurse tried multiple times to replace the 20 French suprapubic catheter that he has in place.  They were unable to get it to pass.  They did not have a smaller one to try.  She reports they have been placing it for many years and never had difficulty previously.  She talked to the provider at [facility] urology and was advised to come to the emergency department for suprapubic catheter replacement.    Prior to Admission medications   Medication Sig Start Date End Date Taking? Authorizing Provider  diazepam (VALIUM) 5 MG tablet Take 5-10 mg by mouth every 6 (six) hours as needed for anxiety. 1/2 tablet at 8am, 2pm and 2 tablets at bedtime    [provider]  methenamine (HIPREX) 1 g tablet Take 1 g by mouth 2 (two) times daily. 11/02/20   [provider]  ondansetron  (ZOFRAN ) 4 MG tablet Take 4 mg by mouth every 4 (four) hours as needed for nausea/vomiting. 11/20/20   [provider]  OXcarbazepine  (TRILEPTAL ) 300 MG/5ML suspension Take 10 mLs (600 mg total) by mouth in the morning, at noon, and at bedtime. 06/06/22   Sater, Sherida Dimmer, MD  solifenacin (VESICARE) 10 MG tablet Take 10 mg by mouth daily. 11/16/21   [provider]    Allergies: Patient has no known allergies.    Review of Systems  Updated Vital Signs BP (!) 141/92   Pulse 74   Temp 98.1 F (36.7 C) (Oral)   Resp 18   Ht 5' 7 (1.702 m)   Wt 55.3 kg   SpO2 96%   BMI 19.09 kg/m   Physical Exam Constitutional:      Comments: Alert.  No acute distress.  No respiratory distress.  HENT:     Mouth/Throat:     Pharynx: Oropharynx is clear.   Pulmonary:     Effort: Pulmonary effort is normal.  Abdominal:     Comments: Abdomen is soft.  There is very small ostomy for the suprapubic catheter which at this time appears constricted.  No drainage or discharge coming from it.   Skin:    General: Skin is warm and dry.   Neurological:     Comments: This patient has significant chronic neurologic disability.    (all labs ordered are listed, but only abnormal results are displayed) Labs Reviewed - No data to display  EKG: None  Radiology: No results found.   SUPRAPUBIC TUBE PLACEMENT  Date/Time: 03/05/2024 9:56 PM  Performed by: Wynetta Heckle, MD Authorized by: Wynetta Heckle, MD   Consent:    Consent obtained:  Verbal   Consent given by:  Parent Procedure details:    Complexity:  Complex   Catheter type:  Foley   Catheter size:  16 Fr   Number of attempts:  1   Urine characteristics:  Blood-tinged Post-procedure details:    Procedure completion:  Tolerated Comments:     Suprapubic ostomy prepped.  74 [place] coud passed with only slight resistance.  Once in place, there was about 25 cc of blood-tinged urine return but no more.  Ultrasound brought to room to confirm  placement.  I could visualize the catheter tubing in the bladder but appeared to potentially be in the urethra.  With multiple repositioning there was fresh blood out of the catheter temporarily which stopped.  After catheter repositioning, some urine output but less than anticipated given volume of urine present on ultrasound.  I subsequently went back to recheck and patient had clear yellow urine in the tubing.    Medications Ordered in the ED - No data to display                                  Medical Decision Making  With routine change of suprapubic catheter at home, patient's mother and nurse were unable to pass a 20 French catheter which historically they changed multiple times without difficulty.  By the time my assessment, the catheter  had been out almost 3 hours.  A 16 French coud used to maintain patency and drain bladder.  There was some question about placement given the lack of immediate return of urine and subsequent blood return but not as much urine as anticipated.  I confirmed that the catheter was in the bladder with ultrasound but urology consulted due to atypical return of blood and then less urine than anticipated.  Consult:Dr. Derrick Fling Urology will see in the ED  Dr. Derrick Fling assessed.  Urine was draining.  He was able to inflate the balloon and irrigate with good returns.  Reports patient is appropriate for disregarded.      Final diagnoses:  Encounter for suprapubic catheter care [facility])    ED Discharge Orders     None          Wynetta Heckle, MD 03/05/24 2205

## 2024-03-05 NOTE — ED Notes (Signed)
[facility] transportation set up

## 2024-03-05 NOTE — ED Triage Notes (Addendum)
 Pt. BIBEMS from home w/ c/o 63F suprapubic cath displaced. Pt. Alert and non-ambulatory. Pt. Has DNR. Pt. Is hospice.  Per EMS: BP: 122/78 HR: 71 SPO2: 98% RA

## 2024-03-05 NOTE — Discharge Instructions (Signed)
 1.  Continue your care of yourself pubic catheter and follow-up as discussed with urology.

## 2024-03-05 NOTE — Consult Note (Signed)
 Consultation: Neurogenic bladder difficult SP tube Requested by: Dr. Wynetta Heckle  History of Present Illness: Dennis Mann is a 48 year old male with MS and his bladder is managed with an SP tube.  Home health tried to change it today and could not get the 20 French catheter reinserted and he was sent to the emergency room.  Nursing and EDP placed a SP tube but were not happy with its position and or irrigation and urology was consulted to interrogate the catheter.  Past Medical History:  Diagnosis Date   Anal fissure    Cognitive impairment    Essential hypertension    Hyperlipidemia    Multiple sclerosis (HCC)    Past Surgical History:  Procedure Laterality Date   IR CATHETER TUBE CHANGE  01/08/2021    Home Medications:  (Not in a hospital admission)  Allergies: No Known Allergies  Family History  Problem Relation Age of Onset   Heart disease Maternal Grandfather    Colon cancer Paternal Grandmother    Diabetes Paternal Grandfather    Social History:  reports that he has never smoked. He has never used smokeless tobacco. He reports that he does not drink alcohol and does not use drugs.  ROS: A complete review of systems was performed.  All systems are negative except for pertinent findings as noted. Review of Systems  All other systems reviewed and are negative.    Physical Exam:  Vital signs in last 24 hours: Temp:  [98.1 F (36.7 C)] 98.1 F (36.7 C) (06/20 1750) Pulse Rate:  [73-74] 74 (06/20 2000) Resp:  [16-18] 18 (06/20 2000) BP: (141-145)/(92-105) 141/92 (06/20 2000) SpO2:  [96 %-100 %] 96 % (06/20 2000) Weight:  [55.3 kg] 55.3 kg (06/20 1813) General:  No acute distress, he is nonverbal but does smile and point HEENT: Normocephalic, atraumatic Cardiovascular: Regular rate and rhythm Lungs: Regular rate and effort but coarse breath sounds Abdomen: Soft, nontender, nondistended, no abdominal masses, a 16 French SP tube is inserted but the balloon is not inflated.   It does appear to be draining urine. Back: No CVA tenderness Extremities: No edema Neurologic: Grossly intact  Procedure: The suprapubic tube was backed out to where I could see the balloon and then reinserted to an appropriate depth in the bladder.  The balloon was inflated to 8 cc and pulled back to the bladder wall.  The catheter balloon was mobile moving into the bladder and then back up to the abdominal wall and the catheter irrigated normally with a 60 cc [place] syringe.  I think some of the issue is his bladder is dependent and it took some volume to get the SP tube to drain and aspirate.  Equal return was noted and the urine was clear.  Laboratory Data:  No results found for this or any previous visit (from the past 24 hours). No results found for this or any previous visit (from the past 240 hours). Creatinine: No results for input(s): CREATININE in the last 168 hours.  Impression/Assessment:  Neurogenic bladder-  Plan:  Continue regular SP tube changes.  Home health could try an 3 [place] next, but if difficult replace with 16 [place].  Christina Coyer 03/05/2024, 10:06 PM

## 2024-03-06 ENCOUNTER — Telehealth (HOSPITAL_COMMUNITY): Payer: Self-pay | Admitting: Emergency Medicine

## 2024-03-06 ENCOUNTER — Emergency Department (HOSPITAL_COMMUNITY)

## 2024-03-06 LAB — URINALYSIS, ROUTINE W REFLEX MICROSCOPIC
Bilirubin Urine: NEGATIVE
Glucose, UA: NEGATIVE mg/dL
Ketones, ur: NEGATIVE mg/dL
Nitrite: NEGATIVE
Protein, ur: 100 mg/dL — AB
RBC / HPF: >50 RBC/hpf (ref 0–5)
Specific Gravity, Urine: 1.015 (ref 1.005–1.030)
WBC, UA: >50 WBC/hpf (ref 0–5)
pH: 8 (ref 5.0–8.0)

## 2024-03-06 LAB — CBC WITH DIFFERENTIAL/PLATELET
Abs Immature Granulocytes: 0.04 10*3/uL (ref 0.00–0.07)
Basophils Absolute: 0.1 10*3/uL (ref 0.0–0.1)
Basophils Relative: 1 %
Eosinophils Absolute: 0.5 10*3/uL (ref 0.0–0.5)
Eosinophils Relative: 5 %
HCT: 40.9 % (ref 39.0–52.0)
Hemoglobin: 13.3 g/dL (ref 13.0–17.0)
Immature Granulocytes: 0 %
Lymphocytes Relative: 15 %
Lymphs Abs: 1.7 10*3/uL (ref 0.7–4.0)
MCH: 29 pg (ref 26.0–34.0)
MCHC: 32.5 g/dL (ref 30.0–36.0)
MCV: 89.3 fL (ref 80.0–100.0)
Monocytes Absolute: 1.3 10*3/uL — ABNORMAL HIGH (ref 0.1–1.0)
Monocytes Relative: 12 %
Neutro Abs: 7.4 10*3/uL (ref 1.7–7.7)
Neutrophils Relative %: 67 %
Platelets: 269 10*3/uL (ref 150–400)
RBC: 4.58 MIL/uL (ref 4.22–5.81)
RDW: 13.2 % (ref 11.5–15.5)
WBC: 10.9 10*3/uL — ABNORMAL HIGH (ref 4.0–10.5)
nRBC: 0 % (ref 0.0–0.2)

## 2024-03-06 LAB — RESP PANEL BY RT-PCR (RSV, FLU A&B, COVID)  RVPGX2
Influenza A by PCR: NEGATIVE
Influenza B by PCR: NEGATIVE
Resp Syncytial Virus by PCR: NEGATIVE
SARS Coronavirus 2 by RT PCR: NEGATIVE

## 2024-03-06 LAB — COMPREHENSIVE METABOLIC PANEL WITH GFR
ALT: 21 U/L (ref 0–44)
AST: 22 U/L (ref 15–41)
Albumin: 4.1 g/dL (ref 3.5–5.0)
Alkaline Phosphatase: 115 U/L (ref 38–126)
Anion gap: 11 (ref 5–15)
BUN: 11 mg/dL (ref 6–20)
CO2: 24 mmol/L (ref 22–32)
Calcium: 9.2 mg/dL (ref 8.9–10.3)
Chloride: 100 mmol/L (ref 98–111)
Creatinine, Ser: 0.57 mg/dL — ABNORMAL LOW (ref 0.61–1.24)
GFR, Estimated: >60 mL/min (ref >60)
Glucose, Bld: 92 mg/dL (ref 70–99)
Potassium: 3.8 mmol/L (ref 3.5–5.1)
Sodium: 135 mmol/L (ref 135–145)
Total Bilirubin: 0.6 mg/dL (ref 0.0–1.2)
Total Protein: 7.7 g/dL (ref 6.5–8.1)

## 2024-03-06 LAB — PROTIME-INR
INR: 1.1 (ref 0.8–1.2)
Prothrombin Time: 13.9 s (ref 11.4–15.2)

## 2024-03-06 LAB — I-STAT CG4 LACTIC ACID, ED: Lactic Acid, Venous: 1.1 mmol/L (ref 0.5–1.9)

## 2024-03-06 MED ORDER — CEPHALEXIN 250 MG/5ML PO SUSR: 500.0000 mg | Freq: Three times a day (TID) | ORAL | 0 refills | Status: AC

## 2024-03-06 MED ORDER — SODIUM CHLORIDE 0.9 % IV BOLUS
1000.0000 mL | Freq: Once | INTRAVENOUS | Status: AC
  Administered 2024-03-06: 1000 mL via INTRAVENOUS

## 2024-03-06 MED ORDER — ACETAMINOPHEN 650 MG RE SUPP
650.0000 mg | Freq: Once | RECTAL | Status: AC
  Administered 2024-03-06: 650 mg via RECTAL
  Filled 2024-03-06: qty 1

## 2024-03-06 MED ORDER — SODIUM CHLORIDE 0.9 % IV SOLN: INTRAVENOUS | Status: DC

## 2024-03-06 MED ORDER — ACETAMINOPHEN 160 MG/5ML PO SOLN
650.0000 mg | Freq: Once | ORAL | Status: DC
  Filled 2024-03-06: qty 20.3

## 2024-03-06 MED ORDER — SODIUM CHLORIDE 0.9 % IV SOLN
1.0000 g | Freq: Once | INTRAVENOUS | Status: AC
  Administered 2024-03-06: 1 g via INTRAVENOUS
  Filled 2024-03-06: qty 10

## 2024-03-06 MED ORDER — CEPHALEXIN 250 MG/5ML PO SUSR: 500.0000 mg | Freq: Three times a day (TID) | ORAL | 0 refills | Status: DC

## 2024-03-06 NOTE — ED Notes (Signed)
 Pt sustaining 02 Sats in mid 80's. Placed on 3L [place].

## 2024-03-06 NOTE — Telephone Encounter (Signed)
 Pt request diff pharmacy

## 2024-03-06 NOTE — ED Provider Notes (Signed)
 I assumed care of this patient from previous provider.  Please see their note for further details of history, exam, and MDM.   Briefly patient is a 48 y.o. male who presented for suprapubic catheter dislodgment was replaced by the previous provider.  The patient was able for discharge and then developed tachycardia and noted to be febrile.  Also patient's oxygen trended down requiring supplemental oxygen.  Septic workup initiated.  Source likely urinary. CXR negative Not septic Tachycardia resolved. O2 requirement resolved Will tx with oral meds.  Mother agrees with plan.  The patient appears reasonably screened and/or stabilized for discharge and I doubt any other medical condition or other [facility] requiring further screening, evaluation, or treatment in the ED at this time. I have discussed the findings, Dx and Tx plan with the patient/family who expressed understanding and agree(s) with the plan. Discharge instructions discussed at length. The patient/family was given strict return precautions who verbalized understanding of the instructions. No further questions at time of discharge.  Disposition: Discharge  Condition: Good  ED Discharge Orders          Ordered    cephALEXin  (KEFLEX ) 250 MG/5ML suspension  3 times daily        03/06/24 0725              Follow Up: Fleeta Valeria Mayo, MD [address] [place] [address] [phone]         Trine Raynell Moder, MD 03/06/24 [phone]

## 2024-03-06 NOTE — ED Notes (Signed)
 [facility] called and transportation cancelled

## 2024-03-08 ENCOUNTER — Telehealth (INDEPENDENT_AMBULATORY_CARE_PROVIDER_SITE_OTHER): Admitting: Neurology

## 2024-03-08 ENCOUNTER — Encounter: Payer: Self-pay | Admitting: Neurology

## 2024-03-08 DIAGNOSIS — Z7401 Bed confinement status: Secondary | ICD-10-CM

## 2024-03-08 DIAGNOSIS — Z515 Encounter for palliative care: Secondary | ICD-10-CM

## 2024-03-08 DIAGNOSIS — R339 Retention of urine, unspecified: Secondary | ICD-10-CM

## 2024-03-08 DIAGNOSIS — G500 Trigeminal neuralgia: Secondary | ICD-10-CM

## 2024-03-08 DIAGNOSIS — G801 Spastic diplegic cerebral palsy: Secondary | ICD-10-CM | POA: Diagnosis not present

## 2024-03-08 DIAGNOSIS — R569 Unspecified convulsions: Secondary | ICD-10-CM | POA: Diagnosis not present

## 2024-03-08 DIAGNOSIS — G35 Multiple sclerosis: Secondary | ICD-10-CM

## 2024-03-08 DIAGNOSIS — R1310 Dysphagia, unspecified: Secondary | ICD-10-CM

## 2024-03-08 DIAGNOSIS — R4189 Other symptoms and signs involving cognitive functions and awareness: Secondary | ICD-10-CM

## 2024-03-08 LAB — URINE CULTURE

## 2024-03-08 NOTE — Progress Notes (Signed)
 [facility]  PATIENT: Dennis Mann DOB: 1976/06/17  REFERRING DOCTOR OR PCP: Rayfield Davies, NP; Selinda Fleeta Finger, MD SOURCE: Patient, notes from [place]  [facility]  _________________________________   HISTORICAL  CHIEF COMPLAINT:  Dennis Mann is a 48 y.o. man with MS and seizures  Virtual Visit via Video Note I connected with Dennis Mann on 03/08/24 at  9:30 AM EDT by a video enabled telemedicine application and verified that I am speaking with the correct person.  I discussed the limitations of evaluation and management by telemedicine and the availability of in person appointments. The patient expressed understanding and agreed to proceed.  Patient at home with mother; provider in office.  [name]  History of Present Illness: Update 03/08/2024 He had another episode of chills and temperature rose from 98 to 103 last week.   He went to the ED.   He has a UTI and has a new Foley.    With the higher temperature, he had seizure like activity first saying he's cold and gritting his teeth.  He received Tylenol  and seizure medication was given early.   Also gave oxycodone  as more pain.   As fever broke symptoms improved.    He went to [facility] and suprapubic changed and he developed tachycardia prompting a short admission.     He lives with his mother.  He has become more nonverbal and only gets one word out like yes/no.   He understands better than he speaks.    He is bed bound.  He is unable to assist with transfers or bear any weight.  They have a Hoyer lift but due to right leg spasticity they rarely leave bed.     He has quadriparesis,. .  Right arm is very weak and left is weak  He has severe right sided weakness and spasticity.  Has milder left leg weakness/spasticity.    Botox  did not help enough (mildly for just a couple weeks) the first time so he did not want to try a higher dose a second time.    The right leg is locked under him.  They try to stretch  some.    He has some pain at times and takes Tylenol   He has poor swallowing and just eats pudding consistency / pureed food.  He He does not want a feeding tube.   He has trouble handling secretions at times and they have a suction machine.  He had a couple hospitalization for aspiration pneumonia.   He has a suprapubic.  He is on methenamine.  He has constipation and mother needs to disimpact.  He takes lactulose.  [name]   He has lost weight.        He takes 10 mg valium at night and sleeps 7-8 hours.  He has a good demeanor.   STM and processing are both poor,   Sometimes in the evening he is more confused and agitated.  No severe sundowning.  His mom usually can calm him down and valium also helps.   He has palliative care with hospice and gets nurse visits twice a week.    He has a suprapubic tube.     His mom flushes it daily.   Skin is in good shape.   Mom has prevented breakdown.  She pads between legs to avoid pressure sores.    He has not had GTC seizures >  2 year .  He is on oxcarbazepine .  He had trigeminal neuralgia  but is not complaining recently.   Oxcarbazepine  has helped.    PE: He is bedbound.  The head is normocephalic and atraumatic.  Sclera are anicteric.  Visible skin appears normal.  The neck has a good range of motion.    He is alert and can respond simple questions.  He has bilateral INO.  Facial strength seems normal. Right > left weakness.  Leg is contracted   MS History : He was diagnosed with MS diagnosed at age 64 in 14 after presenting with diplopia that developed overnight.  His mother noted the eyes were not moving correctly and he went to the emergency room.  A CT in the ED showed abnormality and a follow up MRI was consistent with MS. CSF reportedly showed abnormalities consistent with MS.  He received IV Solumedrol and was started on Betaseron.   He had breakthrough exacerbations including severe ones with right sided weakness and partial blindness.   He was on  Copaxone for a while but had breakthrough exacerbations.   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    He switched to Ocrevus most recently from 2018 to May 2020.  He continued to progress and had his last treatment May 2020.    Mayzent was discussed but they opted not to proceed.   In 2021, he moved from [place]  (was living with his father) to [place] (to live with mother)  He was stable until early November 2021 when he had an influenza vaccination.  Later that day, he could not move and became less responsive over the next couple of days.[name]   He was taken to [facility] and diagnosed with pneumonia.   Although his mental status has returned to normal, he continues to be weaker than he was before the pneumonia.    In 2017 he could transfer but soon became wheelchair/bed dependent.      Images reviewed: MRI of the brain 12/03/2017 and 09/01/2019 showed moderate generalized cortical atrophy.  There are multiple T2/FLAIR hyperintense foci in the periventricular, juxtacortical deep white matter of the hemispheres and in the midbrain, pons, middle cerebellar peduncles and medulla.  There were no enhancing lesions.  MRI of the cervical spine 12/03/2017 and 09/01/2019 showed movement artifact.  In this context, there are patchy T2 hyperintense foci.  Most prominent foci are at C1, C2 and C6 and T1 and T2 there is also moderately severe spinal stenosis at C6-C7 due to a left paramedian disc protrusion and mild to moderate spinal stenosis at C3-C4 and C4-C5 and C5C6.  There is no abnormal enhancement.  REVIEW OF SYSTEMS: Constitutional: No fevers, chills, sweats, or change in appetite. Eyes: No visual changes,eye pain.  He has diplopia Ear, nose and throat: No hearing loss, ear pain, nasal congestion, sore throat Cardiovascular: No chest pain, palpitations Respiratory:  No shortness of breath at rest or with exertion.   No wheezes GastrointestinaI: No nausea, vomiting, diarrhea,  abdominal pain, fecal incontinence Genitourinary: Incontinence as above Musculoskeletal: He has lower back pain. Integumentary: No rash, pruritus, skin lesions Neurological: as above Psychiatric: No depression at this time.  No anxiety Endocrine: No palpitations, diaphoresis, change in appetite, change in weigh or increased thirst Hematologic/Lymphatic:  No anemia, purpura, petechiae. Allergic/Immunologic: No itchy/runny eyes, nasal congestion, recent allergic reactions, rashes  ALLERGIES: No Known Allergies  HOME MEDICATIONS:  Current Outpatient Medications:    cephALEXin  (KEFLEX ) 250 MG/5ML suspension, Take 10 mLs (500 mg total) by mouth 3 (three) times  daily for 7 days., Disp: 250 mL, Rfl: 0   diazepam (VALIUM) 5 MG tablet, Take 5-10 mg by mouth every 6 (six) hours as needed for anxiety. 1/2 tablet at 8am, 2pm and 2 tablets at bedtime, Disp: , Rfl:    methenamine (HIPREX) 1 g tablet, Take 1 g by mouth 2 (two) times daily., Disp: , Rfl:    ondansetron  (ZOFRAN ) 4 MG tablet, Take 4 mg by mouth every 4 (four) hours as needed for nausea/vomiting., Disp: , Rfl:    OXcarbazepine  (TRILEPTAL ) 300 MG/5ML suspension, Take 10 mLs (600 mg total) by mouth in the morning, at noon, and at bedtime., Disp: 900 mL, Rfl: 5   solifenacin (VESICARE) 10 MG tablet, Take 10 mg by mouth daily., Disp: , Rfl:   PAST MEDICAL HISTORY: Past Medical History:  Diagnosis Date   Anal fissure    Cognitive impairment    Essential hypertension    Hyperlipidemia    Multiple sclerosis (HCC)     PAST SURGICAL HISTORY: None  FAMILY HISTORY: Family History  Problem Relation Age of Onset   Heart disease Maternal Grandfather    Colon cancer Paternal Grandmother    Diabetes Paternal Grandfather     PHYSICAL EXAM (from 08/01/2022)   There were no vitals filed for this visit.   There is no height or weight on file to calculate BMI.    General: The patient is well-developed and well-nourished and in no acute  distress.  He is wheelchair-bound   HEENT:  Head is /AT.  Sclera are anicteric.    Skin: Extremities are without rash or  edema.     Neurologic Exam   Mental status: The patient is alert .  He has reduced focus and memory.   Speech is normal.   Cranial nerves: He has bilateral INO's.. Pupils are equal, round, and reactive to light and accomodation. He has reduced vision bilaterally, able to count fingers but not read, left worse than rightl.  Facial symmetry is present. There is good facial sensation to soft touch bilaterally.Facial strength is normal.  No dysarthria is noted.  Hearing seems normal.    Motor:  Muscle bulk is normal.   Muscle tone is increased, right greater than left, legs much greater than arms.  Due to spasticity, legs are crossed.  Strength is 4+ to 5/5 in the left arm, 4 to 4+/5 in the right arm .  Strength is 2+-3/5 in the left leg and 1 to -/5 in the proximal and distal right leg    Sensory: Sensory testing is difficult but he appears to have intact to pinprick, soft touch and vibration sensation in arms and mildly reduced vibration sensation in the legs.   Coordination: Finger-nose-finger is mildly reduced on the left and moderately reduced on the right.  He cannot do heel-to-shin  Gait and station: He is unable to stand   Reflexes: Deep tendon reflexes are increased in legs, right greater than left.  He has crossed abductors at the right knee and nonsustained clonus at the ankles   plantar responses are extensor.    IMPRESSION/PLAN  Multiple sclerosis (HCC)  Spastic diplegia (HCC)  Seizures (HCC)  Trigeminal neuralgia  Cognitive impairment  Dysphagia, unspecified type  Palliative care status  Bedbound  Urinary retention  He will remain off of disease modifying therapy.     Stay active. Continue pureed diet. Oxcarbazepine  for seizures and trigeminal neuralgia.   Will increase the oxcarbazepine  to 600 mg po tid.   Valium  for sleep,  agitation Return in 7 months.  This visit can be virtual if travel is difficult.  Follow Up Instructions: I discussed the assessment and treatment plan with the patient. The patient was provided an opportunity to ask questions and all were answered. The patient agreed with the plan and demonstrated an understanding of the instructions.    The patient was advised to call back or seek an in-person evaluation if the symptoms worsen or if the condition fails to improve as anticipated.  I provided 25 minutes of non-face-to-face time during this encounter  Javious Hallisey A. Vear, MD, [facility] 03/08/2024, 9:41 AM Certified in Neurology, Clinical Neurophysiology, Sleep Medicine and Neuroimaging  [facility] [address] [place], [place] [address] [phone]

## 2024-04-07 ENCOUNTER — Encounter: Payer: Self-pay | Admitting: Neurology

## 2024-04-09 ENCOUNTER — Encounter: Payer: Self-pay | Admitting: Neurology

## 2024-04-09 ENCOUNTER — Encounter: Payer: Self-pay | Admitting: Internal Medicine

## 2024-04-16 DEATH — deceased

## 2024-10-11 ENCOUNTER — Telehealth: Admitting: Neurology
